# Patient Record
Sex: Female | Born: 1955 | Race: Black or African American | Hispanic: No | Marital: Single | Smoking: Former smoker
Health system: Southern US, Community
[De-identification: ages and names within clinical notes are randomized; demographics above are authoritative.]

## PROBLEM LIST (undated history)

## (undated) DIAGNOSIS — E119 Type 2 diabetes mellitus without complications: Secondary | ICD-10-CM

## (undated) DIAGNOSIS — I10 Essential (primary) hypertension: Secondary | ICD-10-CM

## (undated) HISTORY — PX: ABDOMINAL HYSTERECTOMY: SHX81

---

## 1998-06-17 DIAGNOSIS — E8941 Symptomatic postprocedural ovarian failure: Secondary | ICD-10-CM

## 2007-12-24 ENCOUNTER — Emergency Department (HOSPITAL_COMMUNITY): Admission: EM | Admit: 2007-12-24 | Discharge: 2007-12-24 | Payer: Self-pay | Admitting: Emergency Medicine

## 2009-08-14 IMAGING — CR DG CHEST 2V
2 series · 2 of 2 positions shown · non-contrast
Comparison: None

CLINICAL DATA: Pain in the left breast area.

CHEST - 2 VIEW

[w chest pa]
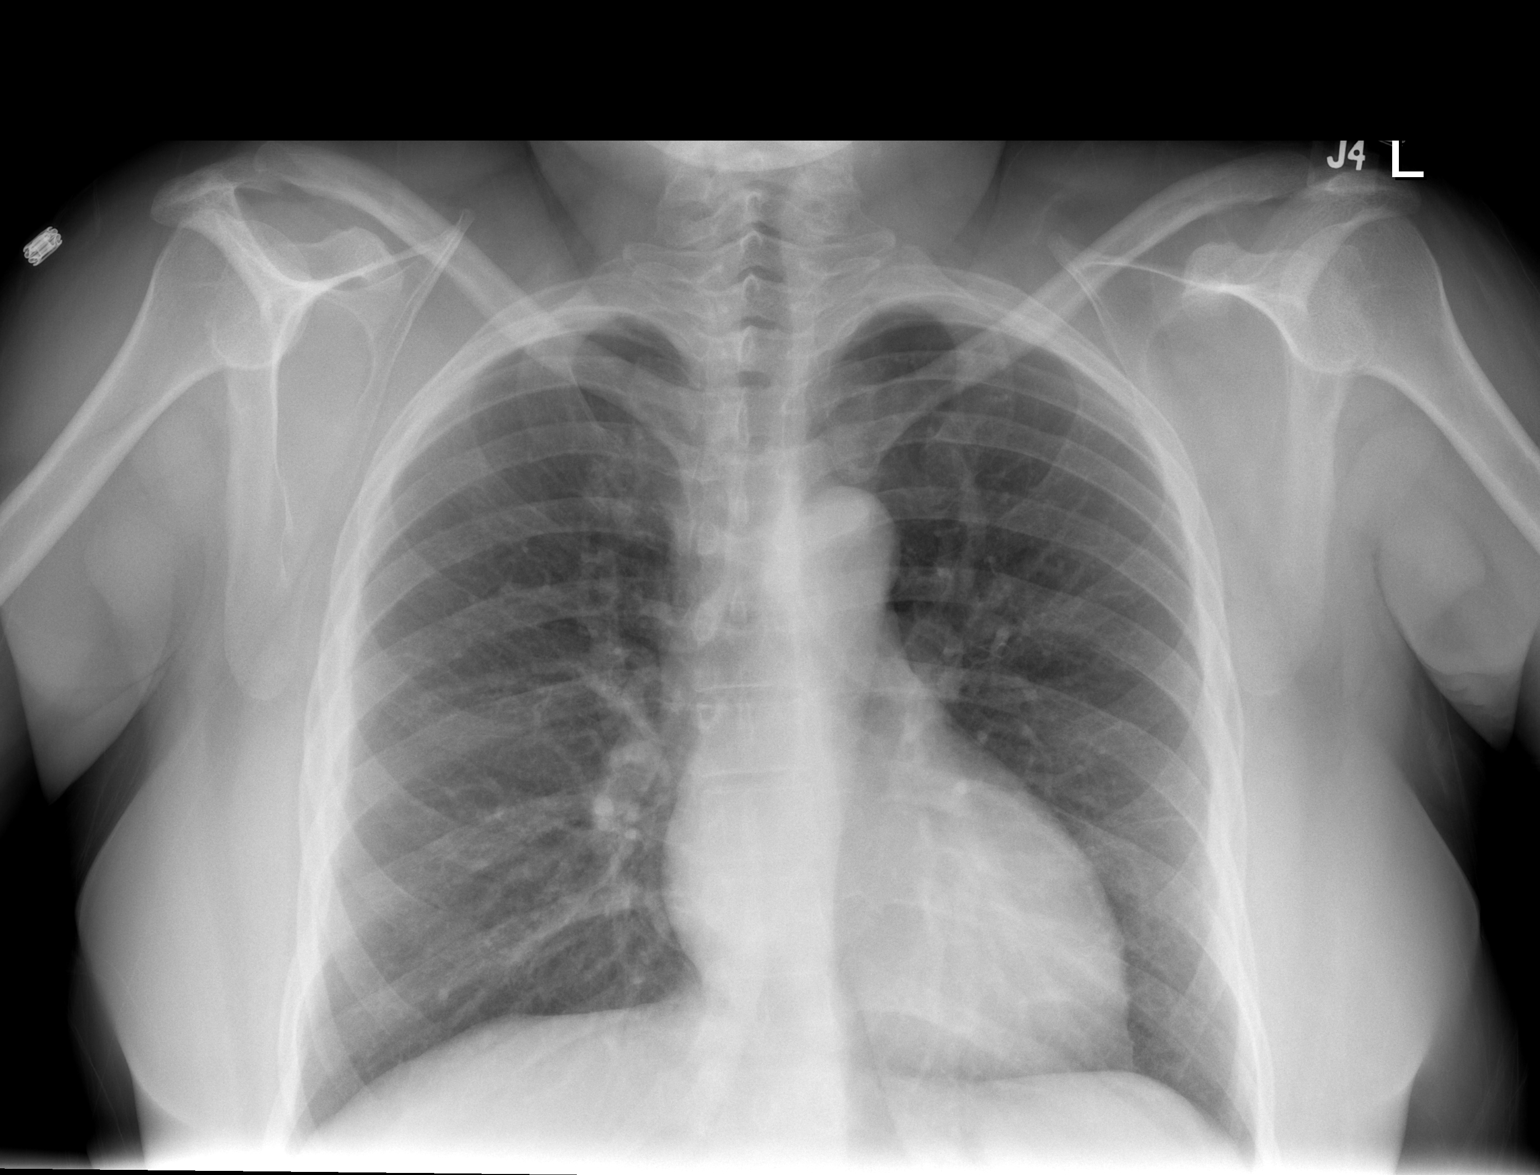

[w chest lat]
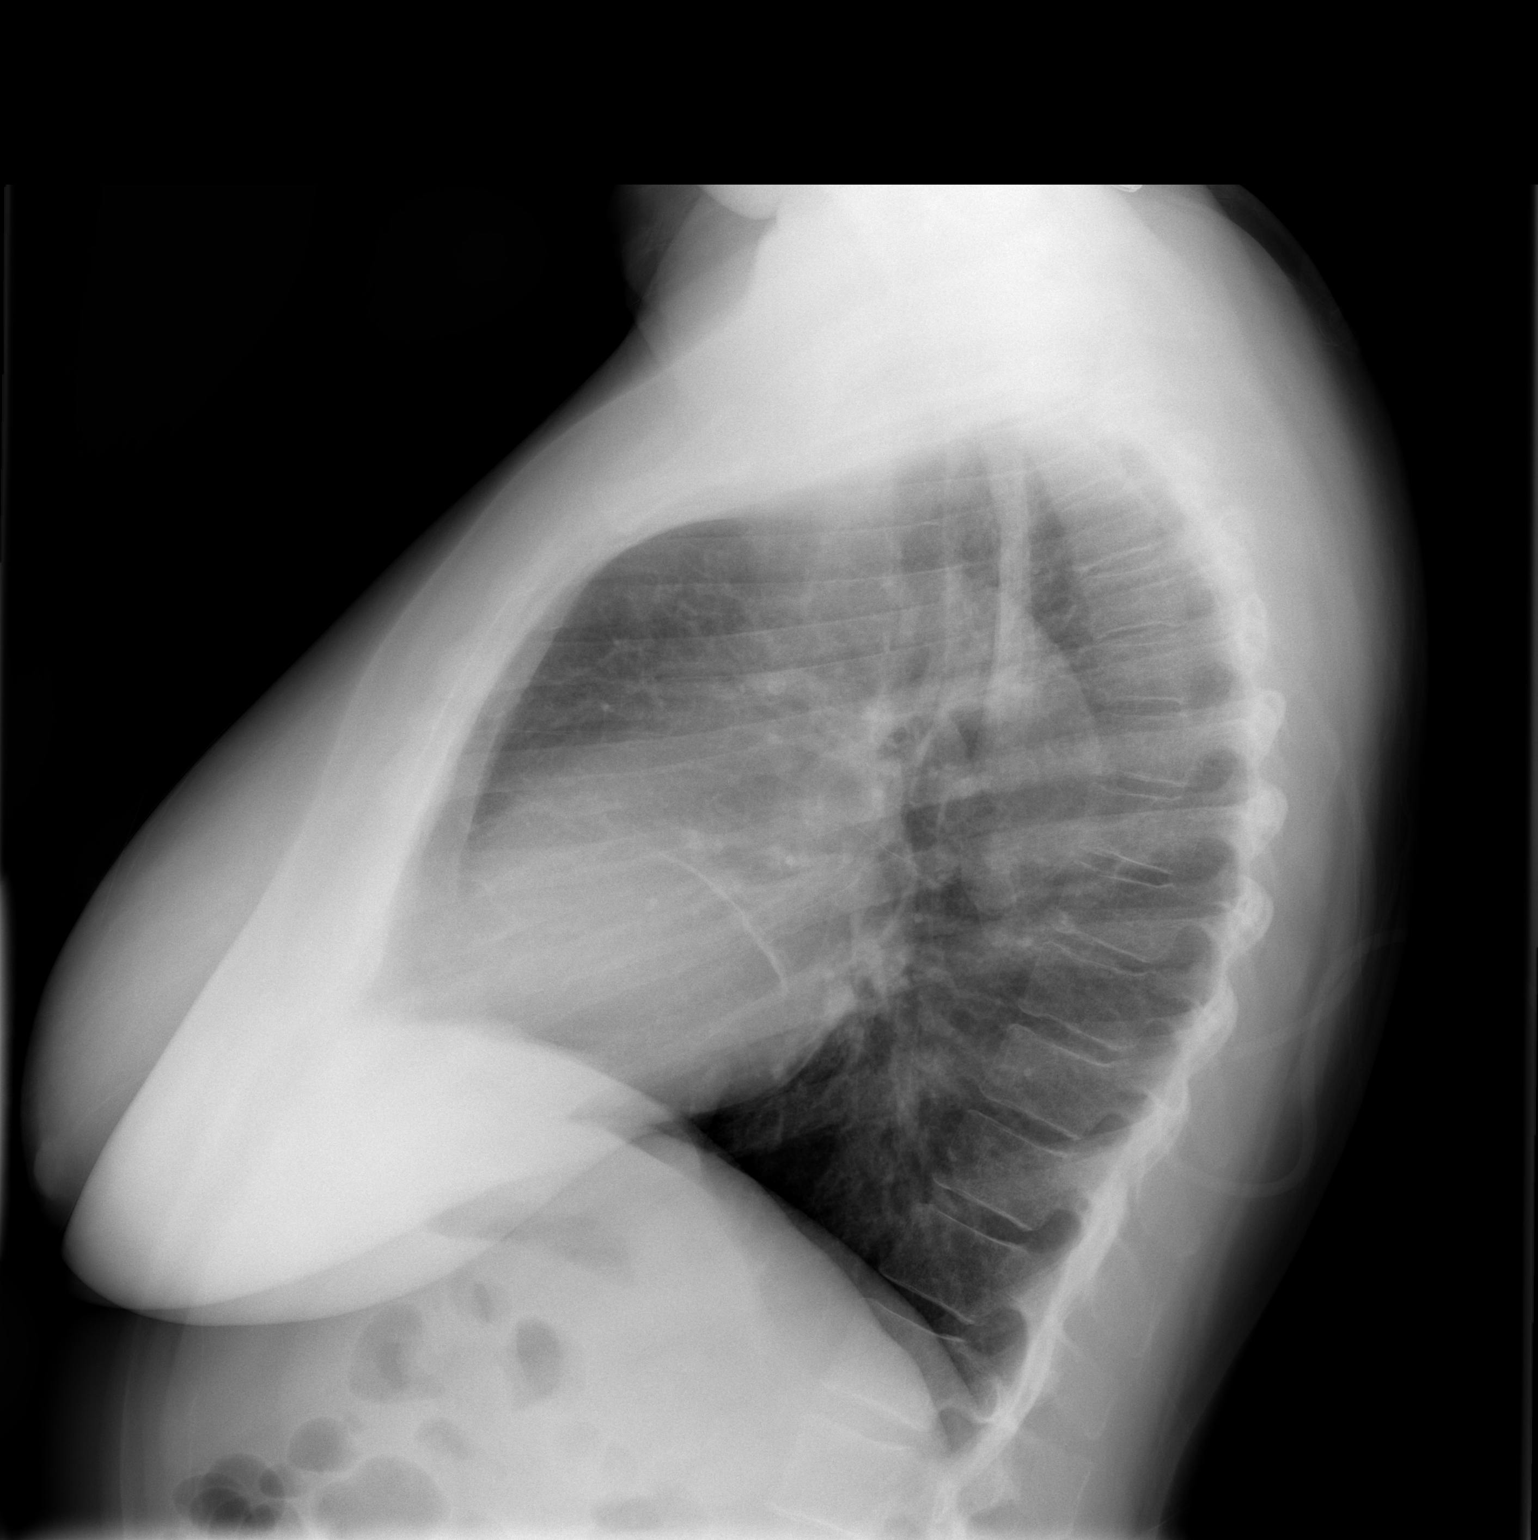

[2 of 2 positions shown; findings below may reference images not displayed]

FINDINGS: The cardiac silhouette is normal size and shape.  Lungs
are free of infiltrates.  No pleural effusions are identified.  A
linear area of subsegmental atelectasis or fibrosis is seen in the
right middle lobe.  Bones appear normal for age.
IMPRESSION: A linear area of subsegmental atelectasis or fibrosis is seen in
the right middle lobe.  Normal otherwise.

## 2009-10-16 ENCOUNTER — Inpatient Hospital Stay (HOSPITAL_COMMUNITY): Admission: EM | Admit: 2009-10-16 | Discharge: 2009-10-17 | Payer: Self-pay | Admitting: Emergency Medicine

## 2009-10-16 LAB — CONVERTED CEMR LAB
BUN: 8 mg/dL
CO2: 9 meq/L
Calcium: 8.1 mg/dL
Creatinine, Ser: 0.45 mg/dL
Glucose, Bld: 248 mg/dL
Platelets: 192 10*3/uL
RDW: 12.8 %
Sodium: 136 meq/L
WBC: 5.1 10*3/uL

## 2009-10-17 LAB — CONVERTED CEMR LAB
Cholesterol: 211 mg/dL
HDL: 40 mg/dL
LDL Cholesterol: 110 mg/dL
Triglycerides: 306 mg/dL
VLDL: 61 mg/dL

## 2009-11-02 ENCOUNTER — Ambulatory Visit: Payer: Self-pay | Admitting: Nurse Practitioner

## 2009-11-02 DIAGNOSIS — E1165 Type 2 diabetes mellitus with hyperglycemia: Secondary | ICD-10-CM

## 2009-11-02 DIAGNOSIS — E785 Hyperlipidemia, unspecified: Secondary | ICD-10-CM

## 2009-11-02 DIAGNOSIS — I10 Essential (primary) hypertension: Secondary | ICD-10-CM

## 2009-11-02 LAB — CONVERTED CEMR LAB
Bilirubin Urine: NEGATIVE
Blood in Urine, dipstick: NEGATIVE
Glucose, Urine, Semiquant: >=1000
Microalb, Ur: 0.5 mg/dL (ref 0.00–1.89)
Protein, U semiquant: NEGATIVE
Specific Gravity, Urine: 1.01
Urobilinogen, UA: NEGATIVE

## 2009-11-30 ENCOUNTER — Ambulatory Visit: Payer: Self-pay | Admitting: Nurse Practitioner

## 2009-11-30 LAB — CONVERTED CEMR LAB
Blood Glucose, Fingerstick: 157
Cholesterol, target level: 200 mg/dL
LDL Goal: 100 mg/dL

## 2010-03-05 ENCOUNTER — Other Ambulatory Visit: Admission: RE | Admit: 2010-03-05 | Discharge: 2010-03-05 | Payer: Self-pay | Admitting: Internal Medicine

## 2010-03-05 ENCOUNTER — Ambulatory Visit: Payer: Self-pay | Admitting: Nurse Practitioner

## 2010-03-05 LAB — CONVERTED CEMR LAB
Bilirubin Urine: NEGATIVE
Blood Glucose, Fingerstick: 95
Glucose, Urine, Semiquant: NEGATIVE
Hgb A1c MFr Bld: 8 %
KOH Prep: NEGATIVE
Ketones, urine, test strip: NEGATIVE
Nitrite: NEGATIVE
OCCULT 1: NEGATIVE
Protein, U semiquant: 30
Rapid HIV Screen: NEGATIVE
Specific Gravity, Urine: 1.015
Urobilinogen, UA: 1
pH: 6

## 2010-03-06 ENCOUNTER — Encounter (INDEPENDENT_AMBULATORY_CARE_PROVIDER_SITE_OTHER): Payer: Self-pay | Admitting: *Deleted

## 2010-03-06 ENCOUNTER — Encounter (INDEPENDENT_AMBULATORY_CARE_PROVIDER_SITE_OTHER): Payer: Self-pay | Admitting: Nurse Practitioner

## 2010-03-06 LAB — CONVERTED CEMR LAB
AST: 33 units/L (ref 0–37)
Albumin: 4.2 g/dL (ref 3.5–5.2)
Alkaline Phosphatase: 56 units/L (ref 39–117)
Basophils Relative: 0 % (ref 0–1)
Calcium: 9.2 mg/dL (ref 8.4–10.5)
Chlamydia, DNA Probe: NEGATIVE
Chloride: 101 meq/L (ref 96–112)
Eosinophils Absolute: 0 10*3/uL (ref 0.0–0.7)
Lymphs Abs: 2.2 10*3/uL (ref 0.7–4.0)
MCHC: 31.4 g/dL (ref 30.0–36.0)
MCV: 80.6 fL (ref 78.0–100.0)
Neutro Abs: 1.5 10*3/uL — ABNORMAL LOW (ref 1.7–7.7)
Neutrophils Relative %: 37 % — ABNORMAL LOW (ref 43–77)
Platelets: 296 10*3/uL (ref 150–400)
Potassium: 4.5 meq/L (ref 3.5–5.3)
RBC: 3.91 M/uL (ref 3.87–5.11)
Sodium: 138 meq/L (ref 135–145)
Total Protein: 7.5 g/dL (ref 6.0–8.3)
WBC: 4.2 10*3/uL (ref 4.0–10.5)

## 2010-03-16 ENCOUNTER — Encounter (INDEPENDENT_AMBULATORY_CARE_PROVIDER_SITE_OTHER): Payer: Self-pay | Admitting: Nurse Practitioner

## 2010-03-16 ENCOUNTER — Telehealth (INDEPENDENT_AMBULATORY_CARE_PROVIDER_SITE_OTHER): Payer: Self-pay | Admitting: Nurse Practitioner

## 2010-03-16 ENCOUNTER — Ambulatory Visit (HOSPITAL_COMMUNITY): Admission: RE | Admit: 2010-03-16 | Discharge: 2010-03-16 | Payer: Self-pay | Admitting: Internal Medicine

## 2010-03-20 ENCOUNTER — Encounter (INDEPENDENT_AMBULATORY_CARE_PROVIDER_SITE_OTHER): Payer: Self-pay | Admitting: Nurse Practitioner

## 2010-03-23 ENCOUNTER — Ambulatory Visit: Payer: Self-pay | Admitting: Nurse Practitioner

## 2010-03-23 LAB — CONVERTED CEMR LAB
Blood Glucose, Fingerstick: 116
HDL: 44 mg/dL (ref >39)
LDL Cholesterol: 134 mg/dL — ABNORMAL HIGH (ref 0–99)
Total CHOL/HDL Ratio: 4.5
Triglycerides: 98 mg/dL (ref <150)
VLDL: 20 mg/dL (ref 0–40)

## 2010-03-26 ENCOUNTER — Encounter (INDEPENDENT_AMBULATORY_CARE_PROVIDER_SITE_OTHER): Payer: Self-pay | Admitting: Nurse Practitioner

## 2010-07-17 NOTE — Letter (Signed)
Summary: *HSN Results Follow up  [facility]  [address] [place], [place] [address]   Phone: [phone]  Fax: [phone]      03/20/2010   Michelle Melendez [address] [place], [place]  [address]   Dear  Ms. Michelle Melendez,                            ____S.Drinkard,FNP   ____D. Gore,FNP       ____B. McPherson,MD   ____V. Rankins,MD    ____E. Mulberry,MD    __X__N. Daphine Deutscher, FNP  ____D. Reche Dixon, MD    ____K. Philipp Deputy, MD    ____Other     This letter is to inform you that your recent test(s):  Stool Cards    ___X____ is within acceptable limits  _______ requires a medication change  _______ requires a follow-up lab visit  _______ requires a follow-up visit with your provider   Comments: Stool cards x 3 are normal.  Thank you for returning them.       _________________________________________________________ If you have any questions, please contact our office [phone].                    Sincerely,    Lehman Prom FNP [facility]

## 2010-07-17 NOTE — Letter (Signed)
Summary: *HSN Results Follow up  [facility]  [address] [place], [place] [address]   Phone: [phone]  Fax: [phone]      03/06/2010   MYRELLA FAHS [address] [place], [place]  [address]   Dear  Ms. Taesha Vidales,                            ____S.Drinkard,FNP   ____D. Gore,FNP       ____B. McPherson,MD   ____V. Rankins,MD    ____E. Mulberry,MD    ____N. Daphine Deutscher, FNP  ____D. Reche Dixon, MD    ____K. Philipp Deputy, MD    ____Other     This letter is to inform you that your recent test(s):  _______Pap Smear    _______Lab Test     _______X-ray    _______ is within acceptable limits  ___X____ requires a medication change  _______ requires a follow-up lab visit  _______ requires a follow-up visit with your provider   Comments:  We have tried to reach you at [phone].  Please contact the office at your earliest convenience       _________________________________________________________ If you have any questions, please contact our office                     Sincerely,  Levon Hedger [facility]

## 2010-07-17 NOTE — Letter (Signed)
Summary: Handout Printed  Printed Handout:  - Diet - Iron Rich 

## 2010-07-17 NOTE — Miscellaneous (Signed)
Summary: Bone density results  Clinical Lists Changes  Observations: Added new observation of DEXANXTDUE: 03/2012 (03/26/2010 19:08) Added new observation of BONE DENSITY:  Lumbar Spine:  T Score > -1.0 Spine.  Hip Total: T Score > -1.0 Hip.   (03/16/2010 19:09)      Bone Density  Procedure date:  03/16/2010  Findings:       Lumbar Spine:  T Score > -1.0 Spine.  Hip Total: T Score > -1.0 Hip.    Comments:       Assessment:  Normal.    Procedures Next Due Date:    Bone Density: 03/2012   Bone Density  Procedure date:  03/16/2010  Findings:       Lumbar Spine:  T Score > -1.0 Spine.  Hip Total: T Score > -1.0 Hip.    Comments:       Assessment:  Normal.    Procedures Next Due Date:    Bone Density: 03/2012

## 2010-07-17 NOTE — Assessment & Plan Note (Signed)
Summary: Diabetes   Vital Signs:  Patient profile:   55 year old female Weight:      151 pounds Temp:     97.9 degrees F Pulse rate:   98 / minute Pulse rhythm:   regular Resp:     18 per minute BP sitting:   132 / 76  (left arm) Cuff size:   regular  Vitals Entered By: Vesta Mixer CMA (November 30, 2009 2:08 PM) CC: dm f/u, Lipid Management, Hypertension Management Is Patient Diabetic? Yes Pain Assessment Patient in pain? no      CBG Result 157  Does patient need assistance? Ambulation Normal   CC:  dm f/u, Lipid Management, and Hypertension Management.  History of Present Illness:  Pt into the office for f/u Seen initially on last month to establish care for diabetes  Diabetes -  no history of pneumovax - will give today pt is checking her blood sugar at least every other day before breakfast  no medications with pt today - advised her to bring all medications during visit  Diabetes Management History:      The patient is a 55 years old female who comes in for evaluation of Type 2 Diabetes Mellitus.  She has not been enrolled in the "Diabetic Education Program".  No sensory loss is reported.  Self foot exams are not being performed.  She is checking home blood sugars.  She says that she is not exercising regularly.        Hypoglycemic symptoms are not occurring.  No hyperglycemic symptoms are reported.  Other comments include: Pt has only been taking lantus 25 units instead of the 30 units as ordered.        Symptoms which suggest diabetic complications include vision problems.  The following changes have been made to her treatment plan since last visit: insulin dosing.  Treatment plan changes were initiated by MD and initiated by D.E.P..    Hypertension History:      She denies headache, chest pain, and palpitations.  She notes no problems with any antihypertensive medication side effects.  Pt is taking medication as ordered.        Positive major cardiovascular risk  factors include diabetes, hyperlipidemia, and hypertension.  Negative major cardiovascular risk factors include female age less than 85 years old and non-tobacco-user status.        Further assessment for target organ damage reveals no history of ASHD, stroke/TIA, or peripheral vascular disease.    Lipid Management History:      Positive NCEP/ATP III risk factors include diabetes and hypertension.  Negative NCEP/ATP III risk factors include female age less than 48 years old, no history of early menopause without estrogen hormone replacement, non-tobacco-user status, no ASHD (atherosclerotic heart disease), no prior stroke/TIA, no peripheral vascular disease, and no history of aortic aneurysm.        The patient states that she does not know about the "Therapeutic Lifestyle Change" diet.  The patient does not know about adjunctive measures for cholesterol lowering.  Comments include: pt is not fasting today.       Habits & Providers  Alcohol-Tobacco-Diet     Alcohol drinks/day: 0     Tobacco Status: never  Exercise-Depression-Behavior     Does Patient Exercise: no     Exercise Counseling: to improve exercise regimen     Type of exercise: walking     Exercise (avg: min/session): 30 min.     Times/week: 3  Drug Use: never  Comments: Pt still has not improved her exercise regimen  Allergies (verified): No Known Drug Allergies  Social History: Does Patient Exercise:  no  Review of Systems General:  Denies fever. ENT:  Denies nasal congestion. CV:  Denies chest pain or discomfort. Resp:  Denies cough. GI:  Denies abdominal pain, nausea, and vomiting. Allergy:  Denies sneezing.  Physical Exam  General:  alert.   Head:  normocephalic.   Eyes:  glasses Lungs:  normal breath sounds.   Heart:  normal rate and regular rhythm.   Msk:  bilateral bunions Neurologic:  alert & oriented X3.   Psych:  Oriented X3.    Diabetes Management Exam:       Nails:          Left foot:  thickened          Right foot: thickened   Impression & Recommendations:  Problem # 1:  DIABETES MELLITUS, UNCONTROLLED (ICD-250.02) advised pt to take lantus 30 units subcutaneously as ordered in addition to oral agents pt needs an eye exam - advised of free eye screening pneumovax given today Her updated medication list for this problem includes:    Benazepril Hcl 5 Mg Tabs (Benazepril hcl) ..... One tablet by mouth daily    Glyburide 2.5 Mg Tabs (Glyburide) ..... One tablet by mouth two times a day for blood sugar    Glucophage 1000 Mg Tabs (Metformin hcl) ..... One tablet by mouth two times a day    Lantus 100 Unit/ml Soln (Insulin glargine) .Marland KitchenMarland KitchenMarland KitchenMarland Kitchen 30 units subcutaneously nightly    Aspir-low 81 Mg Tbec (Aspirin) ..... One tablet by mouth daily for circulation  Problem # 2:  HYPERLIPIDEMIA (ICD-272.4) pt is not fasting today so unable to check labs Her updated medication list for this problem includes:    Simvastatin 20 Mg Tabs (Simvastatin) ..... One tablet by mouth nightly for cholesterol  Problem # 3:  HYPERTENSION, BENIGN ESSENTIAL (ICD-401.1)  Her updated medication list for this problem includes:    Benazepril Hcl 5 Mg Tabs (Benazepril hcl) ..... One tablet by mouth daily  Complete Medication List: 1)  Relion Ultra Thin Lancets 30g Misc (Lancets) 2)  Relion Ultima Test Strp (Glucose blood) 3)  Relion Confirm Glucose Monitor W/device Kit (Blood glucose monitoring suppl) 4)  Simvastatin 20 Mg Tabs (Simvastatin) .... One tablet by mouth nightly for cholesterol 5)  Benazepril Hcl 5 Mg Tabs (Benazepril hcl) .... One tablet by mouth daily 6)  Glyburide 2.5 Mg Tabs (Glyburide) .... One tablet by mouth two times a day for blood sugar 7)  Glucophage 1000 Mg Tabs (Metformin hcl) .... One tablet by mouth two times a day 8)  Lantus 100 Unit/ml Soln (Insulin glargine) .... 30 units subcutaneously nightly 9)  Aspir-low 81 Mg Tbec (Aspirin) .... One tablet by mouth daily for  circulation  Other Orders: Capillary Blood Glucose/CBG ([address]) Pneumococcal Vaccine (60454) Admin 1st Vaccine ([address]) Admin 1st Vaccine [facility]) [phone])  Diabetes Management Assessment/Plan:      The following lipid goals have been established for the patient: Total cholesterol goal of 200; LDL cholesterol goal of 100; HDL cholesterol goal of 40; Triglyceride goal of 150.  Her blood pressure goal is < 130/80.    Hypertension Assessment/Plan:      The patient's hypertensive risk group is category C: Target organ damage and/or diabetes.  Her calculated 10 year risk of coronary heart disease is 17 %.  Today's blood pressure is 132/76.  Her blood pressure  goal is < 130/80.  Lipid Assessment/Plan:      Based on NCEP/ATP III, the patient's risk factor category is "history of diabetes".  The patient's lipid goals are as follows: Total cholesterol goal is 200; LDL cholesterol goal is 100; HDL cholesterol goal is 40; Triglyceride goal is 150.    Patient Instructions: 1)  Follow up in 2 months for a complete physical exam 2)  Come fasting for this appointment - no food after midnight before this visit 3)  you will need tdap, lipids, PAP, Mammogram 4)  Free vision screening - December 06, 2009 5)  9AM to 3pm 6)  [place] DSS 7)  [address]  Prevention & Chronic Care Immunizations   Influenza vaccine: Not documented    Tetanus booster: Not documented    Pneumococcal vaccine: Pneumovax  (11/30/2009)  Colorectal Screening   Hemoccult: Not documented    Colonoscopy: Not documented  Other Screening   Pap smear: Not documented    Mammogram: Not documented   Smoking status: never  (11/30/2009)  Diabetes Mellitus   HgbA1C: >14.0  (11/02/2009)    Eye exam: Not documented    Foot exam: Not documented   High risk foot: Not documented   Foot care education: Not documented    Urine microalbumin/creatinine ratio: Not documented  Lipids   Total Cholesterol: 211   (10/17/2009)   LDL: 110  (10/17/2009)   LDL Direct: Not documented   HDL: 40  (10/17/2009)   Triglycerides: 306  (10/17/2009)    SGOT (AST): Not documented   SGPT (ALT): Not documented   Alkaline phosphatase: Not documented   Total bilirubin: Not documented  Hypertension   Last Blood Pressure: 132 / 76  (11/30/2009)   Serum creatinine: 0.45  (10/16/2009)   Serum potassium 3.4  (10/16/2009)  Self-Management Support :    Diabetes self-management support: Not documented    Hypertension self-management support: Not documented    Lipid self-management support: Not documented    Nursing Instructions: Give Pneumovax today    Pneumovax Vaccine    Vaccine Type: Pneumovax    Site: right deltoid    Mfr: Merck    Dose: 0.5 ml    Route: IM    Given by: Vesta Mixer CMA    Exp. Date: 06/16/2011    Lot #: [id number]    VIS given: 01/13/96 version given November 30, 2009.

## 2010-07-17 NOTE — Assessment & Plan Note (Signed)
Summary: New - Hospital F/U   Vital Signs:  Patient profile:   54 year old female Height:      60.25 inches Weight:      147.8 pounds BMI:     28.73 BSA:     1.65 Temp:     98.2 degrees F oral Pulse rate:   90 / minute Pulse rhythm:   regular Resp:     20 per minute BP sitting:   135 / 79  (left arm) Cuff size:   regular  Vitals Entered ByLevon Hedger (Nov 02, 2009 3:03 PM) CC: follow-up visit [facility] new diabetic Is Patient Diabetic? Yes Pain Assessment Patient in pain? no      CBG Result 264 CBG Device ID A  Does patient need assistance? Functional Status Self care Ambulation Normal   CC:  follow-up visit [facility] new diabetic.  History of Present Illness:  Pt into the office to establish care. No previous PCP  Recent hospitalization on 10/16/2009 at which time she was dx with diabetes  PMH - none prior to recent hospitalizations PSH - Cesarean in one of four deliveries  Prior to presentation to the ER pt was feeling tired and still going to work.   She eventually went to the ER and blood sugar was found to be over 600. Pt was started on lantus 20 units, metformin 750mg  by mouth two times a day, and glyburide 2.5mg  two times a day. Diabetic education done in the hospital. She also received a blood sugar machine and she has been checking blood sugar at least once daily.  Prolonged QT on the last EKG - Cardiac enzymes negative and this was felt seconary to hyperkalemia  Pt has purchased her medications from  [facility] as she has insurance but has lost her job so insurance will stop at the end of this month.    Diabetes Management History:      The patient is a 56 years old female who comes in for evaluation of DM Type 2.  She states lack of understanding of dietary principles and is not following her diet appropriately.  No sensory loss is reported.  Self foot exams are not being performed.  She is checking home blood sugars.        Hypoglycemic symptoms are not occurring.         The following changes have been made to her treatment plan since last visit: insulin dosing and medication changes.  Treatment plan changes were initiated by MD.     Habits & Providers  Alcohol-Tobacco-Diet     Alcohol drinks/day: 0     Tobacco Status: never  Exercise-Depression-Behavior     Exercise Counseling: to improve exercise regimen     Type of exercise: walking     Exercise (avg: min/session): 30 min.     Times/week: 3     Have you felt down or hopeless? no     Have you felt little pleasure in things? no     Drug Use: never  Allergies (verified): No Known Drug Allergies  Past History:  Past Surgical History: c-section x 1  Family History: younger sister - diabetes mother - htn (deceased at age 10) father - leg amputation in a farming accident, deceased from sepsis at age 21  Social History: 4 children tobacco  - none ETOH - none Drug - noneSmoking Status:  never Drug Use:  never  Review of Systems General:  Complains of weight loss; 15 pounds prior to dx.  Eyes:  Denies blurring. CV:  Denies chest pain or discomfort. Resp:  Denies cough. GI:  Denies abdominal pain, nausea, and vomiting. Endo:  Complains of excessive thirst and excessive urination; symptoms have improved since hospitalization.  Physical Exam  General:  alert.   Head:  normocephalic.   Mouth:  poor dentition.   Lungs:  normal breath sounds.   Heart:  normal rate and regular rhythm.   Msk:  up to the exam table Neurologic:  alert & oriented X3.     Impression & Recommendations:  Problem # 1:  DIABETES MELLITUS, UNCONTROLLED (ICD-250.02) new onset pt has a glucometer and has started to check her sugar advised her to do so at least twice daily for now will increase lantus to 30 units Her updated medication list for this problem includes:    Benazepril Hcl 5 Mg Tabs (Benazepril hcl) ..... One tablet by mouth daily    Glyburide 2.5 Mg Tabs (Glyburide) ..... One tablet by mouth  two times a day for blood sugar    Glucophage 1000 Mg Tabs (Metformin hcl) ..... One tablet by mouth two times a day    Lantus 100 Unit/ml Soln (Insulin glargine) .Marland KitchenMarland KitchenMarland KitchenMarland Kitchen 30 units subcutaneously nightly    Aspir-low 81 Mg Tbec (Aspirin)  Orders: UA Dipstick w/o Micro (manual) (04540) Hemoglobin A1C (83036) T-Urine Microalbumin w/creat. ratio [phone])  Problem # 2:  HYPERTENSION, BENIGN ESSENTIAL (ICD-401.1) will monitor advised pt to decrease sodium in her diet Her updated medication list for this problem includes:    Benazepril Hcl 5 Mg Tabs (Benazepril hcl) ..... One tablet by mouth daily  Problem # 3:  HYPERLIPIDEMIA (ICD-272.4) started on statin in hospital will check lipids on next visit Her updated medication list for this problem includes:    Simvastatin 20 Mg Tabs (Simvastatin) ..... One tablet by mouth nightly for cholesterol  Complete Medication List: 1)  Relion Ultra Thin Lancets 30g Misc (Lancets) 2)  Relion Ultima Test Strp (Glucose blood) 3)  Relion Confirm Glucose Monitor W/device Kit (Blood glucose monitoring suppl) 4)  Simvastatin 20 Mg Tabs (Simvastatin) .... One tablet by mouth nightly for cholesterol 5)  Benazepril Hcl 5 Mg Tabs (Benazepril hcl) .... One tablet by mouth daily 6)  Glyburide 2.5 Mg Tabs (Glyburide) .... One tablet by mouth two times a day for blood sugar 7)  Glucophage 1000 Mg Tabs (Metformin hcl) .... One tablet by mouth two times a day 8)  Lantus 100 Unit/ml Soln (Insulin glargine) .... 30 units subcutaneously nightly 9)  Aspir-low 81 Mg Tbec (Aspirin)  Other Orders: Capillary Blood Glucose/CBG (13086)  Patient Instructions: 1)  FRONT DESK STAFF - Explain eligibility process to pt 2)  Schedule an appointment 3)  Diabetes - increase insulin to lantus 30 units nightly 4)  Metformin - may continue 750mg  by mouth two times a day for now but will increase 1000mg  by mouth two times a day on refill. 5)  Follow up in 4 weeks with n.martin,  fnp for diabetes. 6)  Do not eat after midnight before this visit - you will need cholesterol repeated. Will need pneumovax and foot check. 7)  Bring blood sugar log with you to this visit. Prescriptions: LANTUS 100 UNIT/ML SOLN (INSULIN GLARGINE) 30 units subcutaneously nightly  #1 month qs x 1   Entered and Authorized by:   Lehman Prom FNP   Signed by:   Lehman Prom FNP on 11/02/2009   Method used:   Print then Give to Patient   RxID:  [id number] GLUCOPHAGE 1000 MG TABS (METFORMIN HCL) One tablet by mouth two times a day  #60 x 1   Entered and Authorized by:   Lehman Prom FNP   Signed by:   Lehman Prom FNP on 11/02/2009   Method used:   Print then Give to Patient   RxID:   [id number] GLYBURIDE 2.5 MG TABS (GLYBURIDE) One tablet by mouth two times a day for blood sugar  #60 x 1   Entered and Authorized by:   Lehman Prom FNP   Signed by:   Lehman Prom FNP on 11/02/2009   Method used:   Print then Give to Patient   RxID:   [id number] BENAZEPRIL HCL 5 MG TABS (BENAZEPRIL HCL) One tablet by mouth daily  #30 x 1   Entered and Authorized by:   Lehman Prom FNP   Signed by:   Lehman Prom FNP on 11/02/2009   Method used:   Print then Give to Patient   RxID:   [id number] SIMVASTATIN 20 MG TABS (SIMVASTATIN) One tablet by mouth nightly for cholesterol  #30 x 1   Entered and Authorized by:   Lehman Prom FNP   Signed by:   Lehman Prom FNP on 11/02/2009   Method used:   Print then Give to Patient   RxID:   [id number]   Laboratory Results   Urine Tests  Date/Time Received: Nov 02, 2009 4:04 PM   Routine Urinalysis   Color: lt. yellow Appearance: Clear Glucose: >=1000   (Normal Range: Negative) Bilirubin: negative   (Normal Range: Negative) Ketone: negative   (Normal Range: Negative) Spec. Gravity: 1.010   (Normal Range: 1.003-1.035) Blood: negative   (Normal Range: Negative) pH: 6.5   (Normal Range:  5.0-8.0) Protein: negative   (Normal Range: Negative) Urobilinogen: negative   (Normal Range: 0-1) Nitrite: negative   (Normal Range: Negative) Leukocyte Esterace: negative   (Normal Range: Negative)     Blood Tests   Date/Time Received: Nov 02, 2009 3:34 PM   HGBA1C: >14.0%   (Normal Range: Non-Diabetic - 3-6%   Control Diabetic - 6-8%) CBG Random:: 264     Laboratory Results   Urine Tests    Routine Urinalysis   Color: lt. yellow Appearance: Clear Glucose: >=1000   (Normal Range: Negative) Bilirubin: negative   (Normal Range: Negative) Ketone: negative   (Normal Range: Negative) Spec. Gravity: 1.010   (Normal Range: 1.003-1.035) Blood: negative   (Normal Range: Negative) pH: 6.5   (Normal Range: 5.0-8.0) Protein: negative   (Normal Range: Negative) Urobilinogen: negative   (Normal Range: 0-1) Nitrite: negative   (Normal Range: Negative) Leukocyte Esterace: negative   (Normal Range: Negative)     Blood Tests     HGBA1C: >14.0%   (Normal Range: Non-Diabetic - 3-6%   Control Diabetic - 6-8%) CBG Random:: 264mg /dL

## 2010-07-17 NOTE — Letter (Signed)
Summary: PT INFORMATION SHEET  PT INFORMATION SHEET   Imported By: Arta Bruce 12/25/2009 09:35:52  _____________________________________________________________________  External Attachment:    Type:   Image     Comment:   External Document

## 2010-07-17 NOTE — Letter (Signed)
Summary: Handout Printed  Printed Handout:  - Diet - Low-Fat, Low-Saturated-Fat, Low-Cholesterol Diets 

## 2010-07-17 NOTE — Progress Notes (Signed)
Summary: Office Visit//SDEPRESSION SCREENING  Office Visit//SDEPRESSION SCREENING   Imported By: Arta Bruce 03/09/2010 15:03:19  _____________________________________________________________________  External Attachment:    Type:   Image     Comment:   External Document

## 2010-07-17 NOTE — Letter (Signed)
Summary: Lipid Letter  [facility]  [address] [place], [place] [address]   Phone: [phone]  Fax: [phone]    03/06/2010  Shaleka Brines [address] [place], [place]  [address]  Dear Steward Drone:  We have carefully reviewed your last lipid profile from 10/17/2009 and the results are noted below with a summary of recommendations for lipid management.    Cholesterol:      211    Goal: less than 200   HDL "good" Cholesterol:   40     Goal: greater than 40   LDL "bad" Cholesterol:  110    Goal: less than 70   Triglycerides:      306    Goal: less than 150  Labs done during recent office visit show that you are anemic.  You should have been notified by this office about the need to start an iron supplement.  See attached handout for iron rich foods. Pap Smear results ______________________. Your cholesterol is still elevated, especially triglycerides.  You should avoid fried fatty foods.  Continue to take simvastatin as ordered.    Current Medications: 1)    Relion Ultra Thin Lancets 30g  Misc (Lancets) 2)    Relion Ultima Test  Strp (Glucose blood) 3)    Relion Confirm Glucose Monitor W/device Kit (Blood glucose monitoring suppl) 4)    Simvastatin 20 Mg Tabs (Simvastatin) .... One tablet by mouth nightly for cholesterol 5)    Benazepril Hcl 5 Mg Tabs (Benazepril hcl) .... One tablet by mouth daily 6)    Glyburide 5 Mg Tabs (Glyburide) .... One tablet by mouth two times a day for diabetes **note change in dose** 7)    Glucophage 1000 Mg Tabs (Metformin hcl) .... One tablet by mouth two times a day 8)    Lantus 100 Unit/ml Soln (Insulin glargine) .... 30 units subcutaneously nightly 9)    Aspir-low 81 Mg Tbec (Aspirin) .... One tablet by mouth daily for circulation 10)    Ferrous Sulfate 325 (65 Fe) Mg Tabs (Ferrous sulfate) .... One tablet by mouth two times a day  If you have any questions, please call. We appreciate being able to work with  you.   Sincerely,    [facility] NyKedtra Martin,FNP

## 2010-07-17 NOTE — Letter (Signed)
Summary: TEST ORDER MAMMOGRAM//APPT DATE & TIME  TEST ORDER MAMMOGRAM//APPT DATE & TIME   Imported By: Arta Bruce 03/09/2010 15:07:58  _____________________________________________________________________  External Attachment:    Type:   Image     Comment:   External Document

## 2010-07-17 NOTE — Assessment & Plan Note (Signed)
Summary: Complete Physical Exam   Vital Signs:  Patient profile:   55 year old female Menstrual status:  hysterectomy Weight:      148.9 pounds BMI:     28.94 Temp:     98.4 degrees F oral Pulse rate:   88 / minute Pulse rhythm:   regular Resp:     20 per minute BP sitting:   110 / 72  (left arm) Cuff size:   regular  Vitals Entered By: Levon Hedger (March 05, 2010 2:15 PM)  Nutrition Counseling: Patient's BMI is greater than 25 and therefore counseled on weight management options. CC: CPP...feeling very fatigued and laying around alot, Hypertension Management, Lipid Management Pain Assessment Patient in pain? no      CBG Result 95 CBG Device ID A  Does patient need assistance? Functional Status Self care Ambulation Normal     Menstrual Status hysterectomy   CC:  CPP...feeling very fatigued and laying around alot, Hypertension Management, and Lipid Management.  History of Present Illness:  Pt into the office for a complete physical exam  PAP - Last PAP done over 3 years.  Pt had total hysterectomy.  Mammogram - last done about 3 years ago no family history of breast cancer  Optho - last done in June.  She was sponsored by the [employer].  She is in the process of getting referred to get new glasses  Dental - no recent dental exams and no current problems with teeth  Tdap - known that it was within the past 10 years but pt is unsure of the date   Diabetes Management History:      The patient is a 55 years old female who comes in for evaluation of Type 2 Diabetes Mellitus.  She has not been enrolled in the "Diabetic Education Program".  She states understanding of dietary principles and is following her diet appropriately.  No sensory loss is reported.  Self foot exams are not being performed.  She is checking home blood sugars.  She says that she is not exercising regularly.        Hypoglycemic symptoms are not occurring.  No hyperglycemic symptoms are  reported.    Hypertension History:      She denies headache, chest pain, and palpitations.  She notes no problems with any antihypertensive medication side effects.        Positive major cardiovascular risk factors include diabetes, hyperlipidemia, and hypertension.  Negative major cardiovascular risk factors include female age less than 49 years old and non-tobacco-user status.        Further assessment for target organ damage reveals no history of ASHD, cardiac end-organ damage (CHF/LVH), stroke/TIA, peripheral vascular disease, renal insufficiency, or hypertensive retinopathy.    Lipid Management History:      Positive NCEP/ATP III risk factors include diabetes and hypertension.  Negative NCEP/ATP III risk factors include female age less than 84 years old, no history of early menopause without estrogen hormone replacement, non-tobacco-user status, no ASHD (atherosclerotic heart disease), no prior stroke/TIA, no peripheral vascular disease, and no history of aortic aneurysm.        The patient states that she does not know about the "Therapeutic Lifestyle Change" diet.  The patient does not know about adjunctive measures for cholesterol lowering.  She expresses no side effects from her lipid-lowering medication.  The patient denies any symptoms to suggest myopathy or liver disease.      Diabetic Foot Exam Foot Inspection Is there a history  of a foot ulcer?              No Is there a foot ulcer now?              No Can the patient see the bottom of their feet?          No Are the shoes appropriate in style and fit?          Yes Is there swelling or an abnormal foot shape?          No Are the toenails long?                No Are the toenails thick?                Yes Are the toenails ingrown?              No Is there heavy callous build-up?              No Is there a claw toe deformity?              No Is there elevated skin temperature?            No Is there limited ankle dorsiflexion?             No Is there foot or ankle muscle weakness?            No  Diabetic Foot Care Education Patient educated on appropriate care of diabetic feet.  Pulse Check          Right Foot          Left Foot Dorsalis Pedis:        normal            normal    10-g (5.07) Semmes-Weinstein Monofilament Test Performed by: Levon Hedger          Right Foot          Left Foot Visual Inspection                Habits & Providers  Alcohol-Tobacco-Diet     Alcohol drinks/day: 0     Tobacco Status: never  Exercise-Depression-Behavior     Does Patient Exercise: no     Exercise Counseling: to improve exercise regimen     Type of exercise: walking     Exercise (avg: min/session): 30 min.     Times/week: 3     Drug Use: never  Comments: PHQ- 9 score = 10  Allergies: No Known Drug Allergies  Review of Systems General:  Complains of fatigue and sleep disorder; Noticed for the past month she has been weak and is having problems sleeping. Eyes:  Complains of blurring. ENT:  Denies earache. CV:  Complains of fatigue. Resp:  Denies cough. GI:  Denies abdominal pain, nausea, and vomiting. GU:  Denies discharge. MS:  Denies joint pain. Derm:  Denies rash. Neuro:  Denies headaches. Psych:  Denies anxiety and depression.  Physical Exam  General:  alert.   Head:  normocephalic.   Eyes:  pupils equal, pupils round, and pupils reactive to light.   Ears:  bil TM with bony landmarks present Nose:  no nasal discharge.   Neck:  supple.   Chest Wall:  no mass.   Breasts:  skin/areolae normal, no masses, and no abnormal thickening.   Lungs:  normal breath sounds.   Heart:  normal rate and regular rhythm.   Abdomen:  normal bowel sounds.  Rectal:  external hemorrhoid(s).   Msk:  bunions bilaterally Pulses:  R radial normal and L radial normal.   Extremities:  no edema Neurologic:  alert & oriented X3, cranial nerves II-XII intact, and gait normal.   Skin:  color normal.   Psych:  Oriented  X3.    Pelvic Exam  Vulva:      normal appearance.   Urethra and Bladder:      Urethra--normal.   Vagina:      physiologic discharge.   Cervix:      absent Adnexa:      nontender bilaterally.   Rectum:      heme negative stool, + external hemorrhoids.    Diabetes Management Exam:    Foot Exam (with socks and/or shoes not present):       Sensory-Pinprick/Light touch:          Left medial foot (L-4): normal          Left dorsal foot (L-5): normal          Left lateral foot (S-1): normal          Right medial foot (L-4): normal          Right dorsal foot (L-5): normal          Right lateral foot (S-1): normal       Sensory-Monofilament:          Left foot: normal          Right foot: normal       Inspection:          Left foot: normal          Right foot: normal       Nails:          Left foot: thickened          Right foot: thickened    Eye Exam:       Eye Exam done elsewhere          Date: 11/15/2009          Results: normal          Done by: [employer]   Diabetic Foot Exam Foot Inspection Is there a history of a foot ulcer?              No Is there a foot ulcer now?              No Can the patient see the bottom of their feet?          No Are the shoes appropriate in style and fit?          Yes Is there swelling or an abnormal foot shape?          No Are the toenails long?                No Are the toenails thick?                Yes Are the toenails ingrown?              No Is there heavy callous build-up?              No Is there a claw toe deformity?              No Is there elevated skin temperature?            No Is there limited ankle dorsiflexion?  No Is there foot or ankle muscle weakness?            No  Diabetic Foot Care Education Patient educated on appropriate care of diabetic feet.  Pulse Check          Right Foot          Left Foot Dorsalis Pedis:        normal            normal    10-g (5.07) Semmes-Weinstein Monofilament  Test Performed by: Levon Hedger          Right Foot          Left Foot Visual Inspection                 Impression & Recommendations:  Problem # 1:  ROUTINE GYNECOLOGICAL EXAMINATION (ICD-V72.31) PAP done  PHQ- 9 score = 10 labs done exept cholesterol as pt is not fasting rec optho and dental exam guaiac negative  Orders: T- GC Chlamydia (04540) KOH/ WET Mount [phone]) Pap Smear, Thin Prep ( Collection of) (J4782) Hemoccult Guaiac-1 spec.(in office) (82270) T-Syphilis Test (RPR) [phone]) Rapid HIV  (78469) T-CBC w/Diff (62952-84132) T-Comprehensive Metabolic Panel (44010-27253) Hemoccult Cards (Take Home) (Hemoccult Cards)  Problem # 2:  UNSPECIFIED BREAST SCREENING (ICD-V76.10) self breast exam placcard mammogram scheduled Orders: Mammogram (Screening) (Mammo)  Problem # 3:  MENOPAUSE, SURGICAL (ICD-627.4) advise pt of the indication will order bone density Orders: Dexa scan (Dexa scan)  Problem # 4:  DIABETES MELLITUS, UNCONTROLLED (ICD-250.02)  Her updated medication list for this problem includes:    Benazepril Hcl 5 Mg Tabs (Benazepril hcl) ..... One tablet by mouth daily    Glyburide 5 Mg Tabs (Glyburide) ..... One tablet by mouth two times a day for diabetes **note change in dose**    Glucophage 1000 Mg Tabs (Metformin hcl) ..... One tablet by mouth two times a day    Lantus 100 Unit/ml Soln (Insulin glargine) .Marland KitchenMarland KitchenMarland KitchenMarland Kitchen 30 units subcutaneously nightly    Aspir-low 81 Mg Tbec (Aspirin) ..... One tablet by mouth daily for circulation  Orders: Hemoglobin A1C (83036) UA Dipstick w/o Micro (manual) (66440)  Problem # 5:  HYPERTENSION, BENIGN ESSENTIAL (ICD-401.1)  Her updated medication list for this problem includes:    Benazepril Hcl 5 Mg Tabs (Benazepril hcl) ..... One tablet by mouth daily  Orders: EKG w/ Interpretation (93000)  Problem # 6:  HYPERLIPIDEMIA (ICD-272.4)  Her updated medication list for this problem includes:    Simvastatin 20 Mg  Tabs (Simvastatin) ..... One tablet by mouth nightly for cholesterol  Complete Medication List: 1)  Relion Ultra Thin Lancets 30g Misc (Lancets) 2)  Relion Ultima Test Strp (Glucose blood) 3)  Relion Confirm Glucose Monitor W/device Kit (Blood glucose monitoring suppl) 4)  Simvastatin 20 Mg Tabs (Simvastatin) .... One tablet by mouth nightly for cholesterol 5)  Benazepril Hcl 5 Mg Tabs (Benazepril hcl) .... One tablet by mouth daily 6)  Glyburide 5 Mg Tabs (Glyburide) .... One tablet by mouth two times a day for diabetes **note change in dose** 7)  Glucophage 1000 Mg Tabs (Metformin hcl) .... One tablet by mouth two times a day 8)  Lantus 100 Unit/ml Soln (Insulin glargine) .... 30 units subcutaneously nightly 9)  Aspir-low 81 Mg Tbec (Aspirin) .... One tablet by mouth daily for circulation  Other Orders: Capillary Blood Glucose/CBG (34742)  Diabetes Management Assessment/Plan:      The following lipid goals have been established for the patient: Total  cholesterol goal of 200; LDL cholesterol goal of 100; HDL cholesterol goal of 40; Triglyceride goal of 150.  Her blood pressure goal is < 130/80.    Hypertension Assessment/Plan:      The patient's hypertensive risk group is category C: Target organ damage and/or diabetes.  Her calculated 10 year risk of coronary heart disease is 11 %.  Today's blood pressure is 110/72.  Her blood pressure goal is < 130/80.  Lipid Assessment/Plan:      Based on NCEP/ATP III, the patient's risk factor category is "history of diabetes".  The patient's lipid goals are as follows: Total cholesterol goal is 200; LDL cholesterol goal is 100; HDL cholesterol goal is 40; Triglyceride goal is 150.    Patient Instructions: 1)  Labs done today except cholesterol since you were not fasting.  2)  Diabetes - Your hbga1c was 14 and is now down to 8. Doing much better but the goal is less than 7. 3)  Will increase glyburide to 5mg  by mouth two times a day  4)  All other  medications will stay the same 5)  Weakness - will check labs for possible causes 6)  Be sure you are eating three meals per day with snacks in between as this is helpful for your blood sugar 7)  Schedule an appointment in  2 weeks with n.martin,fnp 8)  Will need lipids - no food after midnight before this visit 9)  Will also need flu vaccine if available Prescriptions: GLYBURIDE 5 MG TABS (GLYBURIDE) One tablet by mouth two times a day for diabetes **note change in dose**  #60 x 5   Entered and Authorized by:   Lehman Prom FNP   Signed by:   Lehman Prom FNP on 03/05/2010   Method used:   Print then Give to Patient   RxID:   [id number]      Last LDL:                                                 110 (10/17/2009 2:57:14 PM)        Diabetic Foot Exam Diabetic Foot Care Education :Patient educated on appropriate care of diabetic feet.  Pulse Check          Right Foot          Left Foot Dorsalis Pedis:        normal            normal    10-g (5.07) Semmes-Weinstein Monofilament Test Performed by: Levon Hedger          Right Foot          Left Foot Visual Inspection               Test Control      normal         normal Site 1         normal         normal Site 2         normal         normal Site 3         normal         normal Site 4         normal         normal Site 5  normal         normal Site 6         normal         normal Site 7         normal         normal Site 8         normal         normal Site 9         normal         normal Site 10         normal         normal  Impression      normal         normal   Laboratory Results   Urine Tests  Date/Time Received: March 05, 2010 2:37 PM   Routine Urinalysis   Color: brown Appearance: Hazy Glucose: negative   (Normal Range: Negative) Bilirubin: negative   (Normal Range: Negative) Ketone: negative   (Normal Range: Negative) Spec. Gravity: 1.015   (Normal Range:  1.003-1.035) Blood: small   (Normal Range: Negative) pH: 6.0   (Normal Range: 5.0-8.0) Protein: 30   (Normal Range: Negative) Urobilinogen: 1.0   (Normal Range: 0-1) Nitrite: negative   (Normal Range: Negative) Leukocyte Esterace: small   (Normal Range: Negative)     Blood Tests   Date/Time Received: March 05, 2010 2:40 PM   HGBA1C: 8.0%   (Normal Range: Non-Diabetic - 3-6%   Control Diabetic - 6-8%) CBG Random:: 95  Date/Time Received: March 05, 2010 5:39 PM   Wet Mount/KOH Source: vaginal WBC/hpf: 1-5 Bacteria/hpf: rare Clue cells/hpf: none Yeast/hpf: none Trichomonas/hpf: none  Other Tests  Rapid HIV: negative  Stool - Occult Blood Hemmoccult #1: negative Date: 03/05/2010    Osteoporosis  Patient complains of: kyphosis No back pain No prior fracture No height loss No  Patient reports: Personal history of fracture No Hx of Fx in a 1st degree relative No Caucasian/Asian Race No Advanced Age No Female Gender Yes Dementia No Poor health/fragility No Current cigarette smoker No Low body weight (<127 lbs) No Estrogen deficiency Yes Low calcium intake (lifelong) No Alcoholism No Inadequate physical activity No Poor eyesight/risk of falls No    EKG  Procedure date:  03/05/2010  Findings:      NSR   Prevention & Chronic Care Immunizations   Influenza vaccine: Not documented    Tetanus booster: 06/18/2003: historical per pt    Pneumococcal vaccine: Pneumovax  (11/30/2009)  Colorectal Screening   Hemoccult: Not documented    Colonoscopy: Not documented  Other Screening   Pap smear: Not documented    Mammogram: Not documented   Smoking status: never  (03/05/2010)  Diabetes Mellitus   HgbA1C: 8.0  (03/05/2010)   Hemoglobin A1C due: 06/04/2010    Eye exam: Not documented    Foot exam: yes  (03/05/2010)   Foot exam action/deferral: Do today   High risk foot: Not documented   Foot care education: Done  (03/05/2010)     Urine microalbumin/creatinine ratio: Not documented   Urine microalbumin action/deferral: Ordered   Urine microalbumin/cr due: 03/06/2011  Lipids   Total Cholesterol: 211  (10/17/2009)   LDL: 110  (10/17/2009)   LDL Direct: Not documented   HDL: 40  (10/17/2009)   Triglycerides: 306  (10/17/2009)    SGOT (AST): Not documented   SGPT (ALT): Not documented CMP ordered    Alkaline phosphatase: Not documented   Total bilirubin: Not documented  Hypertension   Last Blood Pressure: 110 / 72  (03/05/2010)   Serum creatinine: 0.45  (10/16/2009)   Serum potassium 3.4  (10/16/2009) CMP ordered   Self-Management Support :    Diabetes self-management support: Not documented    Hypertension self-management support: Not documented    Lipid self-management support: Not documented    Laboratory Results   Urine Tests    Routine Urinalysis   Color: brown Appearance: Hazy Glucose: negative   (Normal Range: Negative) Bilirubin: negative   (Normal Range: Negative) Ketone: negative   (Normal Range: Negative) Spec. Gravity: 1.015   (Normal Range: 1.003-1.035) Blood: small   (Normal Range: Negative) pH: 6.0   (Normal Range: 5.0-8.0) Protein: 30   (Normal Range: Negative) Urobilinogen: 1.0   (Normal Range: 0-1) Nitrite: negative   (Normal Range: Negative) Leukocyte Esterace: small   (Normal Range: Negative)     Blood Tests     HGBA1C: 8.0%   (Normal Range: Non-Diabetic - 3-6%   Control Diabetic - 6-8%) CBG Random:: 95mg /dL    DIRECTV KOH: Negative  Other Tests  Rapid HIV: negative  Stool - Occult Blood Hemmoccult #1: negative    Appended Document: Complete Physical Exam     Allergies: No Known Drug Allergies   Complete Medication List: 1)  Relion Ultra Thin Lancets 30g Misc (Lancets) 2)  Relion Ultima Test Strp (Glucose blood) 3)  Relion Confirm Glucose Monitor W/device Kit (Blood glucose monitoring suppl) 4)  Simvastatin 20 Mg Tabs  (Simvastatin) .... One tablet by mouth nightly for cholesterol 5)  Benazepril Hcl 5 Mg Tabs (Benazepril hcl) .... One tablet by mouth daily 6)  Glyburide 5 Mg Tabs (Glyburide) .... One tablet by mouth two times a day for diabetes **note change in dose** 7)  Glucophage 1000 Mg Tabs (Metformin hcl) .... One tablet by mouth two times a day 8)  Lantus 100 Unit/ml Soln (Insulin glargine) .... 30 units subcutaneously nightly 9)  Aspir-low 81 Mg Tbec (Aspirin) .... One tablet by mouth daily for circulation 10)  Ferrous Sulfate 325 (65 Fe) Mg Tabs (Ferrous sulfate) .... One tablet by mouth two times a day   Laboratory Results  Date/Time Received: March 19, 2010 5:29 PM  Stool - Occult Blood Hemmoccult #1: negative Date: 03/19/2010 Hemoccult #2: negative Date: 03/19/2010 Hemoccult #3: negative Date: 03/19/2010

## 2010-07-17 NOTE — Assessment & Plan Note (Signed)
Summary: Diabetes/HTN   Vital Signs:  Patient profile:   55 year old female Menstrual status:  hysterectomy Weight:      152.5 pounds Temp:     97.5 degrees F oral Pulse rate:   80 / minute Pulse rhythm:   regular Resp:     20 per minute BP sitting:   136 / 80  (left arm) Cuff size:   regular  Vitals Entered By: Levon Hedger (March 23, 2010 9:42 AM) CC: follow-up visit 2 weeks, Hypertension Management, Lipid Management Is Patient Diabetic? Yes Pain Assessment Patient in pain? no      CBG Result 116 CBG Device ID B  Does patient need assistance? Functional Status Self care Ambulation Normal   CC:  follow-up visit 2 weeks, Hypertension Management, and Lipid Management.  History of Present Illness:  Pt into the office for f/u on weakness, diabetes and htn.  No medications today in office with pt. Reviewed medication with pt - pharmacy review indicates that pt lasted picked up 02/07/2010  Diabetes Management History:      The patient is a 55 years old female who comes in for evaluation of Type 2 Diabetes Mellitus.  She has not been enrolled in the "Diabetic Education Program".  She states understanding of dietary principles and is following her diet appropriately.  No sensory loss is reported.  Self foot exams are not being performed.  She is checking home blood sugars.  She says that she is not exercising regularly.        Hypoglycemic symptoms are not occurring.  No hyperglycemic symptoms are reported.  Other comments include: she is checking her blood sugars at home.        No changes have been made to her treatment plan since last visit.    Hypertension History:      She denies headache, chest pain, and palpitations.  She notes no problems with any antihypertensive medication side effects.        Positive major cardiovascular risk factors include diabetes, hyperlipidemia, and hypertension.  Negative major cardiovascular risk factors include female age less than 14  years old and non-tobacco-user status.        Further assessment for target organ damage reveals no history of ASHD, cardiac end-organ damage (CHF/LVH), stroke/TIA, peripheral vascular disease, renal insufficiency, or hypertensive retinopathy.    Lipid Management History:      Positive NCEP/ATP III risk factors include diabetes and hypertension.  Negative NCEP/ATP III risk factors include female age less than 58 years old, no history of early menopause without estrogen hormone replacement, non-tobacco-user status, no ASHD (atherosclerotic heart disease), no prior stroke/TIA, no peripheral vascular disease, and no history of aortic aneurysm.        The patient states that she does not know about the "Therapeutic Lifestyle Change" diet.  The patient does not know about adjunctive measures for cholesterol lowering.  She expresses no side effects from her lipid-lowering medication.  The patient denies any symptoms to suggest myopathy or liver disease.       Allergies (verified): No Known Drug Allergies  Review of Systems General:  Denies fever and weakness. CV:  Denies chest pain or discomfort. Resp:  Denies cough. GI:  Denies abdominal pain, nausea, and vomiting.  Physical Exam  General:  alert.   Head:  normocephalic.   Lungs:  normal breath sounds.   Heart:  normal rate and regular rhythm.   Msk:  bunion.  bilaterally Neurologic:  alert & oriented  X3.   Skin:  color normal.   Psych:  Oriented X3.    Diabetes Management Exam:    Foot Exam (with socks and/or shoes not present):       Sensory-Monofilament:          Left foot: normal          Right foot: normal       Nails:          Left foot: thickened          Right foot: thickened   Impression & Recommendations:  Problem # 1:  DIABETES MELLITUS, UNCONTROLLED (ICD-250.02) last hgba1c = 8.0 advised pt to take meds as ordered Her updated medication list for this problem includes:    Benazepril Hcl 5 Mg Tabs (Benazepril hcl)  ..... One tablet by mouth daily    Glyburide 5 Mg Tabs (Glyburide) ..... One tablet by mouth two times a day for diabetes **note change in dose**    Glucophage 1000 Mg Tabs (Metformin hcl) ..... One tablet by mouth two times a day    Lantus 100 Unit/ml Soln (Insulin glargine) .Marland KitchenMarland KitchenMarland KitchenMarland Kitchen 30 units subcutaneously nightly    Aspir-low 81 Mg Tbec (Aspirin) ..... One tablet by mouth daily for circulation  Problem # 2:  HYPERLIPIDEMIA (ICD-272.4)  will check lipids today Her updated medication list for this problem includes:    Simvastatin 20 Mg Tabs (Simvastatin) ..... One tablet by mouth nightly for cholesterol  Orders: T-Lipid Profile ([id number])  Problem # 3:  HYPERTENSION, BENIGN ESSENTIAL (ICD-401.1)  Her updated medication list for this problem includes:    Benazepril Hcl 5 Mg Tabs (Benazepril hcl) ..... One tablet by mouth daily  Problem # 4:  NEED PROPHYLACTIC VACCINATION&INOCULATION FLU (ICD-V04.81) given today in office  Complete Medication List: 1)  Relion Ultra Thin Lancets 30g Misc (Lancets) 2)  Relion Ultima Test Strp (Glucose blood) 3)  Relion Confirm Glucose Monitor W/device Kit (Blood glucose monitoring suppl) 4)  Simvastatin 20 Mg Tabs (Simvastatin) .... One tablet by mouth nightly for cholesterol 5)  Benazepril Hcl 5 Mg Tabs (Benazepril hcl) .... One tablet by mouth daily 6)  Glyburide 5 Mg Tabs (Glyburide) .... One tablet by mouth two times a day for diabetes **note change in dose** 7)  Glucophage 1000 Mg Tabs (Metformin hcl) .... One tablet by mouth two times a day 8)  Lantus 100 Unit/ml Soln (Insulin glargine) .... 30 units subcutaneously nightly 9)  Aspir-low 81 Mg Tbec (Aspirin) .... One tablet by mouth daily for circulation 10)  Ferrous Sulfate 325 (65 Fe) Mg Tabs (Ferrous sulfate) .... One tablet by mouth two times a day  Other Orders: Capillary Blood Glucose/CBG (14782) Flu Vaccine 73yrs + [phone]) Admin 1st Vaccine (30865) Admin 1st Vaccine [facility])  [phone])  Diabetes Management Assessment/Plan:      The following lipid goals have been established for the patient: Total cholesterol goal of 200; LDL cholesterol goal of 100; HDL cholesterol goal of 40; Triglyceride goal of 150.  Her blood pressure goal is < 130/80.    Hypertension Assessment/Plan:      The patient's hypertensive risk group is category C: Target organ damage and/or diabetes.  Her calculated 10 year risk of coronary heart disease is 17 %.  Today's blood pressure is 136/80.  Her blood pressure goal is < 130/80.  Lipid Assessment/Plan:      Based on NCEP/ATP III, the patient's risk factor category is "history of diabetes".  The patient's lipid goals are as follows: Total  cholesterol goal is 200; LDL cholesterol goal is 100; HDL cholesterol goal is 40; Triglyceride goal is 150.    Patient Instructions: 1)  Diabetes - You should be taking glyburide 5mg  by mouth two times a day.  You should also be taking metformin 1000mg  by mouth two times a day daily. 2)  Also continue your current dose of insulin 3)  You have been given the flu vaccine today. 4)  Your cholesterol should be checked today and you will be notified of the results. 5)  Follow up in 3 months with n.martin,fnp for diabetes 6)  will given cbg, u/a, hgba1c. Prescriptions: LANTUS 100 UNIT/ML SOLN (INSULIN GLARGINE) 30 units subcutaneously nightly  #1 month qs x 5   Entered and Authorized by:   Lehman Prom FNP   Signed by:   Lehman Prom FNP on 03/23/2010   Method used:   Faxed to ...       [facility] - [facility] (retail)       [address] [place], [place]  [address]       Ph: [phone]       Fax: [phone]   RxID:   [id number] GLUCOPHAGE 1000 MG TABS (METFORMIN HCL) One tablet by mouth two times a day  #60 x 5   Entered and Authorized by:   Lehman Prom FNP   Signed by:   Lehman Prom FNP on 03/23/2010   Method used:   Faxed to ...       [facility] - [facility] (retail)       [address] [place], [place]  [address]       Ph: [phone]       Fax: [phone]   RxID:   [id number] GLYBURIDE 5 MG TABS (GLYBURIDE) One tablet by mouth two times a day for diabetes **note change in dose**  #60 x 5   Entered and Authorized by:   Lehman Prom FNP   Signed by:   Lehman Prom FNP on 03/23/2010   Method used:   Faxed to ...       [facility] - Pharmac (retail)       [address] [place], [place]  [address]       Ph: [phone]       Fax: [phone]   RxID:   [id number] BENAZEPRIL HCL 5 MG TABS (BENAZEPRIL HCL) One tablet by mouth daily  #30 x 5   Entered and Authorized by:   Lehman Prom FNP   Signed by:   Lehman Prom FNP on 03/23/2010   Method used:   Faxed to ...       [facility] - Pharmac (retail)       [address] [place], [place]  [address]       Ph: [phone]       Fax: [phone]   RxID:   [id number] SIMVASTATIN 20 MG TABS (SIMVASTATIN) One tablet by mouth nightly for cholesterol  #30 x 5   Entered and Authorized by:   Lehman Prom FNP   Signed by:   Lehman Prom FNP on 03/23/2010   Method used:   Faxed to ...       [facility] - [facility] (retail)       [address]  [place], [place]  [address]       Ph: [phone]       Fax: [phone]   RxID:   [id number]   Diabetic Foot Exam Foot Inspection Is there a history of a foot ulcer?              No Is there a foot ulcer now?              No Can the patient see the bottom of their feet?          Yes Are the shoes appropriate in style and fit?          Yes Is there swelling or an abnormal foot shape?          No Are the toenails long?                No Are the toenails thick?                Yes Are the toenails ingrown?              No Is there heavy callous build-up?               No Is there pain in the calf muscle (Intermittent claudication) when walking?    NoIs there a claw toe deformity?              No Is there elevated skin temperature?            No Is there limited ankle dorsiflexion?            No Is there foot or ankle muscle weakness?            No  Diabetic Foot Care Education Patient educated on appropriate care of diabetic feet.  Pulse Check          Right Foot          Left Foot Dorsalis Pedis:        normal            normal    10-g (5.07) Semmes-Weinstein Monofilament Test Performed by: Levon Hedger          Right Foot          Left Foot Visual Inspection                Laboratory Results   Blood Tests     CBG Random:: 116       Influenza Vaccine    Vaccine Type: Fluvax 3+    Site: left deltoid    Mfr: GlaxoSmithKline    Dose: 0.5 ml    Route: IM    Given by: Levon Hedger    Exp. Date: 11/2010    Lot #: [id number]    VIS given: 01/09/10 version given March 23, 2010.  Flu Vaccine Consent Questions    Do you have a history of severe allergic reactions to this vaccine? no    Any prior history of allergic reactions to egg and/or gelatin? no    Do you have a sensitivity to the preservative Thimersol? no    Do you have a past history of Guillan-Barre Syndrome? no    Do you currently have an acute febrile illness? no    Have you ever had a severe reaction to latex? no    Vaccine information given and explained to patient? yes    Are you currently pregnant? no   Last LDL:  110 (10/17/2009 2:57:14 PM)          Diabetic Foot Exam Diabetic Foot Care Education :Patient educated on appropriate care of diabetic feet.  Pulse Check          Right Foot          Left Foot Dorsalis Pedis:        normal            normal    10-g (5.07) Semmes-Weinstein Monofilament Test Performed by: Levon Hedger          Right Foot          Left Foot Visual Inspection                Test Control      normal         normal Site 1         normal         normal Site 2         normal         normal Site 3         normal         normal Site 4         normal         normal Site 5         normal         normal Site 6         normal         normal Site 7         normal         normal Site 8         normal         normal Site 9         normal         normal Site 10         normal         normal  Impression      normal         normal   Prevention & Chronic Care Immunizations   Influenza vaccine: Fluvax 3+  (03/23/2010)    Tetanus booster: 06/18/2003: historical per pt    Pneumococcal vaccine: Pneumovax  (11/30/2009)  Colorectal Screening   Hemoccult: Not documented    Colonoscopy: Not documented  Other Screening   Pap smear:  Specimen Adequacy: Satisfactory for evaluation.   Interpretation/Result:Negative for intraepithelial Lesion or Malignancy.     (03/05/2010)   Pap smear due: 03/2011    Mammogram: ASSESSMENT: Negative - BI-RADS 1^MM DIGITAL SCREENING  (03/16/2010)   Smoking status: never  (03/05/2010)  Diabetes Mellitus   HgbA1C: 8.0  (03/05/2010)   Hemoglobin A1C due: 06/04/2010    Eye exam: Not documented    Foot exam: yes  (03/23/2010)   Foot exam action/deferral: Do today   High risk foot: Not documented   Foot care education: Done  (03/23/2010)    Urine microalbumin/creatinine ratio: Not documented   Urine microalbumin action/deferral: Ordered   Urine microalbumin/cr due: 03/06/2011  Lipids   Total Cholesterol: 211  (10/17/2009)   LDL: 110  (10/17/2009)   LDL Direct: Not documented   HDL: 40  (10/17/2009)   Triglycerides: 306  (10/17/2009)    SGOT (AST): 33  (03/05/2010)   SGPT (ALT): 40  (03/05/2010)   Alkaline phosphatase: 56  (03/05/2010)   Total bilirubin: 0.3  (03/05/2010)  Hypertension   Last Blood Pressure: 136 /  80  (03/23/2010)   Serum creatinine: 0.57  (03/05/2010)   Serum potassium 4.5   (03/05/2010)  Self-Management Support :    Diabetes self-management support: Not documented    Hypertension self-management support: Not documented    Lipid self-management support: Not documented    Nursing Instructions: Give Flu vaccine today

## 2010-07-17 NOTE — Progress Notes (Signed)
Summary: LETTER FOR MED CHANGE   Phone Note Call from Patient   Caller: Patient Reason for Call: Talk to Nurse Summary of Call: PT GOT A LETTER IN THE MAIL TATING ABOUT MEDS CHANGE WANTS TO KNOW  WHICH MEDS IS THAT  Initial call taken by: Oscar La,  March 16, 2010 9:42 AM  Follow-up for Phone Call        spoke with pt and informed her of needing to take a iron supplement (ferrous sulfate) .  Pt states she has an appt on Friday  03/23/10. Follow-up by: Levon Hedger,  March 19, 2010 11:05 AM

## 2010-07-17 NOTE — Letter (Signed)
Summary: Lipid Letter  [facility]  [address] [place], [place] [address]   Phone: [phone]  Fax: [phone]    03/26/2010  Michelle Melendez [address] [place], [place]  [address]  Dear Steward Drone:  We have carefully reviewed your last lipid profile from 03/23/2010 and the results are noted below with a summary of recommendations for lipid management.    Cholesterol:       198     Goal: less than 200   HDL "good" Cholesterol:   44     Goal: greater than 40   LDL "bad" Cholesterol:   134     Goal: less than 70   Triglycerides:       98     Goal: less than 150     Your "Bad cholesterol" is still slightly elevated.  Continue to take medication as ordered.  Also avoid fried fatty foods.  Bone density results are normal.     Current Medications: 1)    Relion Ultra Thin Lancets 30g  Misc (Lancets) 2)    Relion Ultima Test  Strp (Glucose blood) 3)    Relion Confirm Glucose Monitor W/device Kit (Blood glucose monitoring suppl) 4)    Simvastatin 20 Mg Tabs (Simvastatin) .... One tablet by mouth nightly for cholesterol 5)    Benazepril Hcl 5 Mg Tabs (Benazepril hcl) .... One tablet by mouth daily 6)    Glyburide 5 Mg Tabs (Glyburide) .... One tablet by mouth two times a day for diabetes **note change in dose** 7)    Glucophage 1000 Mg Tabs (Metformin hcl) .... One tablet by mouth two times a day 8)    Lantus 100 Unit/ml Soln (Insulin glargine) .... 30 units subcutaneously nightly 9)    Aspir-low 81 Mg Tbec (Aspirin) .... One tablet by mouth daily for circulation 10)    Ferrous Sulfate 325 (65 Fe) Mg Tabs (Ferrous sulfate) .... One tablet by mouth two times a day  If you have any questions, please call. We appreciate being able to work with you.   Sincerely,    [facility] Lehman Prom FNP

## 2010-07-19 ENCOUNTER — Ambulatory Visit: Admit: 2010-07-19 | Payer: Self-pay | Attending: Internal Medicine | Admitting: Internal Medicine

## 2010-09-04 LAB — POCT CARDIAC MARKERS
CKMB, poc: <1 ng/mL — ABNORMAL LOW (ref 1.0–8.0)
Myoglobin, poc: 43.3 ng/mL (ref 12–200)
Troponin i, poc: <0.05 ng/mL (ref 0.00–0.09)

## 2010-09-04 LAB — CBC
MCHC: 32.9 g/dL (ref 30.0–36.0)
MCV: 81.8 fL (ref 78.0–100.0)
Platelets: 192 10*3/uL (ref 150–400)
RBC: 4.57 MIL/uL (ref 3.87–5.11)
RDW: 12.8 % (ref 11.5–15.5)

## 2010-09-04 LAB — URINALYSIS, ROUTINE W REFLEX MICROSCOPIC
Glucose, UA: >1000 mg/dL — AB
Ketones, ur: 40 mg/dL — AB
Leukocytes, UA: NEGATIVE
pH: 5.5 (ref 5.0–8.0)

## 2010-09-04 LAB — BASIC METABOLIC PANEL
BUN: 9 mg/dL (ref 6–23)
Calcium: 8.1 mg/dL — ABNORMAL LOW (ref 8.4–10.5)
Chloride: 102 mEq/L (ref 96–112)
GFR calc Af Amer: >60 mL/min (ref >60)
GFR calc non Af Amer: >60 mL/min (ref >60)
Glucose, Bld: 248 mg/dL — ABNORMAL HIGH (ref 70–99)
Glucose, Bld: 380 mg/dL — ABNORMAL HIGH (ref 70–99)
Potassium: 3.5 mEq/L (ref 3.5–5.1)
Sodium: 136 mEq/L (ref 135–145)

## 2010-09-04 LAB — COMPREHENSIVE METABOLIC PANEL
AST: 13 U/L (ref 0–37)
CO2: 19 mEq/L (ref 19–32)
Calcium: 8.7 mg/dL (ref 8.4–10.5)
Creatinine, Ser: 0.75 mg/dL (ref 0.4–1.2)
GFR calc Af Amer: >60 mL/min (ref >60)
GFR calc non Af Amer: >60 mL/min (ref >60)
Total Protein: 6.9 g/dL (ref 6.0–8.3)

## 2010-09-04 LAB — GLUCOSE, CAPILLARY
Glucose-Capillary: 484 mg/dL — ABNORMAL HIGH (ref 70–99)
Glucose-Capillary: >600 mg/dL (ref 70–99)

## 2010-09-04 LAB — DIFFERENTIAL
Eosinophils Relative: 0 % (ref 0–5)
Lymphocytes Relative: 29 % (ref 12–46)
Lymphs Abs: 1.5 10*3/uL (ref 0.7–4.0)

## 2010-09-04 LAB — LIPID PANEL
Cholesterol: 211 mg/dL — ABNORMAL HIGH (ref 0–200)
HDL: 40 mg/dL (ref >39)
Total CHOL/HDL Ratio: 5.3 RATIO

## 2010-09-04 LAB — URINE MICROSCOPIC-ADD ON

## 2011-03-14 LAB — POCT I-STAT, CHEM 8
Chloride: 100
Glucose, Bld: 180 — ABNORMAL HIGH
HCT: 37
Hemoglobin: 12.6
Potassium: 3.1 — ABNORMAL LOW
Sodium: 134 — ABNORMAL LOW

## 2011-03-14 LAB — DIFFERENTIAL
Basophils Relative: 1
Eosinophils Absolute: 0
Lymphs Abs: 1.8
Monocytes Absolute: 0.4
Monocytes Relative: 8
Neutro Abs: 2.7
Neutrophils Relative %: 55

## 2011-03-14 LAB — URINALYSIS, ROUTINE W REFLEX MICROSCOPIC
Nitrite: NEGATIVE
Specific Gravity, Urine: 1.007
Urobilinogen, UA: 2 — ABNORMAL HIGH
pH: 6.5

## 2011-03-14 LAB — POCT CARDIAC MARKERS
CKMB, poc: <1 — ABNORMAL LOW
Myoglobin, poc: 54.5

## 2011-03-14 LAB — CBC
Hemoglobin: 11.7 — ABNORMAL LOW
MCHC: 32.7
MCV: 78.6
RBC: 4.53
WBC: 4.9

## 2011-03-14 LAB — URINE MICROSCOPIC-ADD ON

## 2012-09-17 ENCOUNTER — Emergency Department (HOSPITAL_COMMUNITY): Payer: No Typology Code available for payment source

## 2012-09-17 ENCOUNTER — Encounter (HOSPITAL_COMMUNITY): Payer: Self-pay | Admitting: Emergency Medicine

## 2012-09-17 ENCOUNTER — Emergency Department (HOSPITAL_COMMUNITY)
Admission: EM | Admit: 2012-09-17 | Discharge: 2012-09-18 | Disposition: A | Discharge status: Home / Self Care | Payer: No Typology Code available for payment source | Attending: Emergency Medicine | Admitting: Emergency Medicine

## 2012-09-17 DIAGNOSIS — I10 Essential (primary) hypertension: Secondary | ICD-10-CM | POA: Insufficient documentation

## 2012-09-17 DIAGNOSIS — F29 Unspecified psychosis not due to a substance or known physiological condition: Secondary | ICD-10-CM | POA: Insufficient documentation

## 2012-09-17 DIAGNOSIS — S0003XA Contusion of scalp, initial encounter: Secondary | ICD-10-CM | POA: Insufficient documentation

## 2012-09-17 DIAGNOSIS — R000 Tachycardia, unspecified: Secondary | ICD-10-CM | POA: Insufficient documentation

## 2012-09-17 DIAGNOSIS — Y92410 Unspecified street and highway as the place of occurrence of the external cause: Secondary | ICD-10-CM | POA: Insufficient documentation

## 2012-09-17 DIAGNOSIS — S0093XA Contusion of unspecified part of head, initial encounter: Secondary | ICD-10-CM

## 2012-09-17 DIAGNOSIS — IMO0001 Reserved for inherently not codable concepts without codable children: Secondary | ICD-10-CM | POA: Insufficient documentation

## 2012-09-17 DIAGNOSIS — Y9389 Activity, other specified: Secondary | ICD-10-CM | POA: Insufficient documentation

## 2012-09-17 HISTORY — DX: Type 2 diabetes mellitus without complications: E11.9

## 2012-09-17 HISTORY — DX: Essential (primary) hypertension: I10

## 2012-09-17 MED ORDER — MORPHINE SULFATE 4 MG/ML IJ SOLN
4.0000 mg | Freq: Once | INTRAMUSCULAR | Status: AC
  Administered 2012-09-17: 4 mg via INTRAVENOUS
  Filled 2012-09-17: qty 1

## 2012-09-17 MED ORDER — SODIUM CHLORIDE 0.9 % IV BOLUS (SEPSIS)
1000.0000 mL | Freq: Once | INTRAVENOUS | Status: AC
  Administered 2012-09-17: 1000 mL via INTRAVENOUS
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
## 2016-07-29 ENCOUNTER — Other Ambulatory Visit: Payer: Self-pay | Admitting: Nurse Practitioner

## 2016-07-29 DIAGNOSIS — Z1231 Encounter for screening mammogram for malignant neoplasm of breast: Secondary | ICD-10-CM

## 2016-08-13 ENCOUNTER — Ambulatory Visit
Admission: RE | Admit: 2016-08-13 | Discharge: 2016-08-13 | Disposition: A | Discharge status: Home / Self Care | Payer: BLUE CROSS/BLUE SHIELD | Source: Ambulatory Visit | Attending: Nurse Practitioner | Admitting: Nurse Practitioner

## 2016-08-13 DIAGNOSIS — Z1231 Encounter for screening mammogram for malignant neoplasm of breast: Secondary | ICD-10-CM

## 2016-08-27 ENCOUNTER — Other Ambulatory Visit: Payer: Self-pay | Admitting: Gastroenterology

## 2016-08-27 ENCOUNTER — Ambulatory Visit
Admission: RE | Admit: 2016-08-27 | Discharge: 2016-08-27 | Disposition: A | Discharge status: Home / Self Care | Payer: BLUE CROSS/BLUE SHIELD | Source: Ambulatory Visit | Attending: Gastroenterology | Admitting: Gastroenterology

## 2016-08-27 DIAGNOSIS — K5909 Other constipation: Secondary | ICD-10-CM

## 2020-09-25 ENCOUNTER — Other Ambulatory Visit: Payer: Self-pay

## 2020-09-25 ENCOUNTER — Encounter (HOSPITAL_COMMUNITY): Payer: Self-pay

## 2020-09-25 ENCOUNTER — Emergency Department (HOSPITAL_COMMUNITY)
Admission: EM | Admit: 2020-09-25 | Discharge: 2020-09-25 | Disposition: A | Discharge status: Home / Self Care | Payer: 59 | Attending: Emergency Medicine | Admitting: Emergency Medicine

## 2020-09-25 DIAGNOSIS — Z7984 Long term (current) use of oral hypoglycemic drugs: Secondary | ICD-10-CM | POA: Insufficient documentation

## 2020-09-25 DIAGNOSIS — R110 Nausea: Secondary | ICD-10-CM

## 2020-09-25 DIAGNOSIS — Z79899 Other long term (current) drug therapy: Secondary | ICD-10-CM | POA: Diagnosis not present

## 2020-09-25 DIAGNOSIS — Z7982 Long term (current) use of aspirin: Secondary | ICD-10-CM | POA: Insufficient documentation

## 2020-09-25 DIAGNOSIS — E1169 Type 2 diabetes mellitus with other specified complication: Secondary | ICD-10-CM | POA: Insufficient documentation

## 2020-09-25 DIAGNOSIS — E785 Hyperlipidemia, unspecified: Secondary | ICD-10-CM | POA: Diagnosis not present

## 2020-09-25 DIAGNOSIS — Z87891 Personal history of nicotine dependence: Secondary | ICD-10-CM | POA: Diagnosis not present

## 2020-09-25 DIAGNOSIS — R9431 Abnormal electrocardiogram [ECG] [EKG]: Secondary | ICD-10-CM | POA: Diagnosis not present

## 2020-09-25 LAB — CBC
HCT: 37.2 % (ref 36.0–46.0)
Hemoglobin: 11.7 g/dL — ABNORMAL LOW (ref 12.0–15.0)
MCH: 26.6 pg (ref 26.0–34.0)
MCHC: 31.5 g/dL (ref 30.0–36.0)
MCV: 84.5 fL (ref 80.0–100.0)
Platelets: 240 10*3/uL (ref 150–400)
RBC: 4.4 MIL/uL (ref 3.87–5.11)
RDW: 13.5 % (ref 11.5–15.5)
WBC: 7.1 10*3/uL (ref 4.0–10.5)
nRBC: 0 % (ref 0.0–0.2)

## 2020-09-25 LAB — BASIC METABOLIC PANEL
Anion gap: 9 (ref 5–15)
BUN: 13 mg/dL (ref 8–23)
CO2: 26 mmol/L (ref 22–32)
Calcium: 9.3 mg/dL (ref 8.9–10.3)
Chloride: 100 mmol/L (ref 98–111)
Creatinine, Ser: 0.63 mg/dL (ref 0.44–1.00)
GFR, Estimated: >60 mL/min (ref >60)
Glucose, Bld: 330 mg/dL — ABNORMAL HIGH (ref 70–99)
Potassium: 3.3 mmol/L — ABNORMAL LOW (ref 3.5–5.1)
Sodium: 135 mmol/L (ref 135–145)

## 2020-09-25 LAB — TROPONIN I (HIGH SENSITIVITY): Troponin I (High Sensitivity): 2 ng/L (ref <18)

## 2020-09-25 NOTE — ED Notes (Signed)
No need to draw 2nd troponin per Dr. Rubin Payor.

## 2020-09-25 NOTE — Discharge Instructions (Addendum)
Your EKG is reassuring today.  Follow-up with your doctors for further evaluation

## 2020-09-25 NOTE — ED Provider Notes (Signed)
[facility] EMERGENCY DEPARTMENT Provider Note   CSN: [id number] Arrival date & time: 09/25/20  1042     History Chief Complaint  Patient presents with  . Abnormal ECG    Michelle Melendez is a 65 y.o. female.  HPI Patient sent in by urgent care.  Reportedly was there for her normal wellness checkup.  Patient states that she took all her medicines morning without eating and that she is feeling a little nauseous because of it.  States they did an EKG at the urgent care and sent her in because it was abnormal.  Report is that it was irregular but there are 2 different complexes circled on the EKG that appears.  They are thinking more of ST changes.  Patient not having chest pain.  States the nausea is improved but still mildly there.  No known history of coronary artery disease.  No problems with exertion.    Past Medical History:  Diagnosis Date  . Diabetes mellitus without complication (HCC)   . Hypertension     Patient Active Problem List   Diagnosis Date Noted  . DIABETES MELLITUS, UNCONTROLLED 11/02/2009  . HYPERLIPIDEMIA 11/02/2009  . HYPERTENSION, BENIGN ESSENTIAL 11/02/2009  . MENOPAUSE, SURGICAL 06/17/1998    Past Surgical History:  Procedure Laterality Date  . ABDOMINAL HYSTERECTOMY       OB History   No obstetric history on file.     No family history on file.  Social History   Tobacco Use  . Smoking status: Former Games developer  . Smokeless tobacco: Never Used  . Tobacco comment: quit 2004  Substance Use Topics  . Alcohol use: Yes    Alcohol/week: 6.0 standard drinks    Types: 6 Cans of beer per week  . Drug use: Never    Home Medications Prior to Admission medications   Medication Sig Start Date End Date Taking? Authorizing Provider  aspirin EC 81 MG tablet Take 81 mg by mouth daily. Swallow whole.   Yes [provider]  gabapentin (NEURONTIN) 300 MG capsule Take 300 mg by mouth 3 (three) times daily.   Yes [provider]  glyBURIDE (DIABETA) 5 MG tablet Take 5 mg by mouth daily with breakfast.   Yes [provider]  metFORMIN (GLUCOPHAGE) 500 MG tablet Take 1,000 mg by mouth 2 (two) times daily with a meal.   Yes [provider]  simvastatin (ZOCOR) 80 MG tablet Take 80 mg by mouth daily.   Yes [provider]  amLODipine (NORVASC) 5 MG tablet Take 5 mg by mouth daily. Patient not taking: No sig reported    [provider]  benazepril (LOTENSIN) 10 MG tablet Take 10 mg by mouth daily. Patient not taking: No sig reported    [provider]    Allergies    Patient has no known allergies.  Review of Systems   Review of Systems  Constitutional: Negative for appetite change.  HENT: Negative for congestion.   Respiratory: Negative for shortness of breath.   Cardiovascular: Negative for chest pain.  Gastrointestinal: Positive for nausea.  Genitourinary: Negative for flank pain.  Musculoskeletal: Negative for gait problem.  Skin: Negative for rash.  Neurological: Negative for weakness.  Psychiatric/Behavioral: Negative for confusion.    Physical Exam Updated Vital Signs BP (!) 158/77   Pulse 90   Temp 98.7 F (37.1 C) (Oral)   Resp 15   Ht 5' (1.524 m)   Wt 68 kg   SpO2 96%  BMI 29.29 kg/m   Physical Exam Vitals and nursing note reviewed.  HENT:     Head: Atraumatic.  Eyes:     Pupils: Pupils are equal, round, and reactive to light.  Cardiovascular:     Rate and Rhythm: Regular rhythm.  Pulmonary:     Breath sounds: Normal breath sounds.  Abdominal:     General: There is no distension.  Musculoskeletal:        General: No tenderness.     Cervical back: Neck supple.     Right lower leg: No edema.     Left lower leg: No edema.  Skin:    General: Skin is warm.     Capillary Refill: Capillary refill takes less than 2 seconds.  Neurological:     Mental Status: She is alert and oriented to person, place, and time.     ED  Results / Procedures / Treatments   Labs (all labs ordered are listed, but only abnormal results are displayed) Labs Reviewed  BASIC METABOLIC PANEL - Abnormal; Notable for the following components:      Result Value   Potassium 3.3 (*)    Glucose, Bld 330 (*)    All other components within normal limits  CBC - Abnormal; Notable for the following components:   Hemoglobin 11.7 (*)    All other components within normal limits  TROPONIN I (HIGH SENSITIVITY)  TROPONIN I (HIGH SENSITIVITY)    EKG EKG Interpretation  Date/Time:  Monday September 25 2020 11:15:56 EDT Ventricular Rate:  76 PR Interval:  155 QRS Duration: 85 QT Interval:  454 QTC Calculation: 511 R Axis:   25 Text Interpretation: Sinus arrhythmia Prolonged QT interval Confirmed by Benjiman Core [phone]) on 09/25/2020 12:11:51 PM   Radiology No results found.  Procedures Procedures   Medications Ordered in ED Medications - No data to display  ED Course  I have reviewed the triage vital signs and the nursing notes.  Pertinent labs & imaging results that were available during my care of the patient were reviewed by me and considered in my medical decision making (see chart for details).    MDM Rules/Calculators/A&P                          Patient sent in from urgent care.  Reportedly only complaint was some nauseousness.  States it is from taking all her medicines once morning.  Reportedly sent in for abnormal EKG.  Did have some nonspecific changes on the twelve-lead but not really that worrisome.  Troponin done due to nausea to evaluate if this could have been an anginal equivalent.  Troponin negative.  Lab work reassuring.  Discharge home. Final Clinical Impression(s) / ED Diagnoses Final diagnoses:  Nausea    Rx / DC Orders ED Discharge Orders    None       Benjiman Core, MD 09/27/20 [phone]

## 2020-09-25 NOTE — ED Triage Notes (Signed)
BIB EMS from UC for abnormal EKG. Was at [facility] for wellness check up. Sinus arrhythmia (normal for pt) 18rr, 148/86, 97.4 temp, 98% on room air. No pain.

## 2020-11-29 ENCOUNTER — Other Ambulatory Visit: Payer: Self-pay | Admitting: Family Medicine

## 2020-11-29 DIAGNOSIS — Z1231 Encounter for screening mammogram for malignant neoplasm of breast: Secondary | ICD-10-CM

## 2021-01-29 ENCOUNTER — Ambulatory Visit
Admission: RE | Admit: 2021-01-29 | Discharge: 2021-01-29 | Disposition: A | Discharge status: Home / Self Care | Payer: 59 | Source: Ambulatory Visit | Attending: Family Medicine | Admitting: Family Medicine

## 2021-01-29 ENCOUNTER — Other Ambulatory Visit: Payer: Self-pay

## 2021-01-29 DIAGNOSIS — Z1231 Encounter for screening mammogram for malignant neoplasm of breast: Secondary | ICD-10-CM

## 2021-04-02 ENCOUNTER — Emergency Department (HOSPITAL_COMMUNITY)
Admission: EM | Admit: 2021-04-02 | Discharge: 2021-04-03 | Disposition: A | Discharge status: Home / Self Care | Payer: 59 | Attending: Emergency Medicine | Admitting: Emergency Medicine

## 2021-04-02 ENCOUNTER — Emergency Department (HOSPITAL_COMMUNITY): Payer: 59

## 2021-04-02 DIAGNOSIS — Z5321 Procedure and treatment not carried out due to patient leaving prior to being seen by health care provider: Secondary | ICD-10-CM | POA: Insufficient documentation

## 2021-04-02 DIAGNOSIS — M25511 Pain in right shoulder: Secondary | ICD-10-CM | POA: Insufficient documentation

## 2021-04-02 DIAGNOSIS — R000 Tachycardia, unspecified: Secondary | ICD-10-CM | POA: Insufficient documentation

## 2021-04-02 DIAGNOSIS — R079 Chest pain, unspecified: Secondary | ICD-10-CM | POA: Diagnosis not present

## 2021-04-02 LAB — BASIC METABOLIC PANEL
Anion gap: 13 (ref 5–15)
BUN: 10 mg/dL (ref 8–23)
CO2: 26 mmol/L (ref 22–32)
Calcium: 9.6 mg/dL (ref 8.9–10.3)
Chloride: 94 mmol/L — ABNORMAL LOW (ref 98–111)
Creatinine, Ser: 0.65 mg/dL (ref 0.44–1.00)
GFR, Estimated: >60 mL/min (ref >60)
Glucose, Bld: 326 mg/dL — ABNORMAL HIGH (ref 70–99)
Potassium: 3.5 mmol/L (ref 3.5–5.1)
Sodium: 133 mmol/L — ABNORMAL LOW (ref 135–145)

## 2021-04-02 LAB — CBC
HCT: 37.7 % (ref 36.0–46.0)
Hemoglobin: 11.9 g/dL — ABNORMAL LOW (ref 12.0–15.0)
MCH: 26 pg (ref 26.0–34.0)
MCHC: 31.6 g/dL (ref 30.0–36.0)
MCV: 82.3 fL (ref 80.0–100.0)
Platelets: 270 10*3/uL (ref 150–400)
RBC: 4.58 MIL/uL (ref 3.87–5.11)
RDW: 12.6 % (ref 11.5–15.5)
WBC: 6 10*3/uL (ref 4.0–10.5)
nRBC: 0 % (ref 0.0–0.2)

## 2021-04-02 LAB — TROPONIN I (HIGH SENSITIVITY)
Troponin I (High Sensitivity): 4 ng/L (ref <18)
Troponin I (High Sensitivity): 4 ng/L (ref <18)

## 2021-04-02 MED ORDER — ACETAMINOPHEN 325 MG PO TABS
650.0000 mg | ORAL_TABLET | Freq: Once | ORAL | Status: AC
  Administered 2021-04-02: 650 mg via ORAL
  Filled 2021-04-02: qty 2

## 2021-04-02 MED ORDER — IBUPROFEN 800 MG PO TABS
800.0000 mg | ORAL_TABLET | Freq: Once | ORAL | Status: AC
  Administered 2021-04-02: 800 mg via ORAL
  Filled 2021-04-02: qty 2

## 2021-04-02 NOTE — ED Triage Notes (Signed)
Pt. Stated, Ive had rt. Shoulder pain that goes to my back and chest for the last 2 weeks.

## 2021-04-02 NOTE — ED Provider Notes (Signed)
Emergency Medicine Provider Triage Evaluation Note  Michelle Melendez , a 65 y.o. female  was evaluated in triage.  Pt complains of right-sided chest, shoulder, arm pain for the last 2 weeks, patient does report that she has had increased use of the right arm at work, increased scrubbing, patient reports that the pain is shooting in nature, associated with activity, somewhat relieved by her gabapentin.  Patient additionally presents with tachycardia, elevated blood pressure despite taking her normal blood pressure medications.  Patient denies history of fast heartbeat at rest, does report that she has had "irregular heartbeat".  Patient denies shortness of breath, nausea, vomiting, history of previous acute coronary syndrome, however patient is diabetic.  Review of Systems  Positive: Chest pain, arm pain, tachycardia Negative: Shortness of breath, nausea, vomiting, diaphoresis  Physical Exam  BP (!) 202/96 (BP Location: Left Arm)   Pulse (!) 125   Temp 98.8 F (37.1 C) (Oral)   Resp 20   SpO2 97%  Gen:   Awake, no distress   Resp:  Normal effort  MSK:   Moves extremities without difficulty  Other:  Tenderness to palpation of the right shoulder blade, right humeral head, right shaft of humerus, and right chest wall  Medical Decision Making  Medically screening exam initiated at 10:16 AM.  Appropriate orders placed.  Michelle Melendez was informed that the remainder of the evaluation will be completed by another provider, this initial triage assessment does not replace that evaluation, and the importance of remaining in the ED until their evaluation is complete.  MSK pain, tachycardia --we will obtain ACS labs given abnormal vital signs, potential atypical presentation of ACS r/o, do favor MSK as most likely   Olene Floss, PA-C 04/02/21 1018    Mancel Bale, MD 04/02/21 2039

## 2021-04-03 NOTE — ED Notes (Signed)
Pt left AMA °

## 2022-01-15 ENCOUNTER — Other Ambulatory Visit: Payer: Self-pay | Admitting: Nurse Practitioner

## 2022-01-15 DIAGNOSIS — Z780 Asymptomatic menopausal state: Secondary | ICD-10-CM

## 2022-04-13 ENCOUNTER — Other Ambulatory Visit (HOSPITAL_BASED_OUTPATIENT_CLINIC_OR_DEPARTMENT_OTHER): Payer: Self-pay | Admitting: Nurse Practitioner

## 2022-04-13 DIAGNOSIS — M25551 Pain in right hip: Secondary | ICD-10-CM

## 2022-04-13 DIAGNOSIS — M25561 Pain in right knee: Secondary | ICD-10-CM

## 2022-04-13 DIAGNOSIS — M25571 Pain in right ankle and joints of right foot: Secondary | ICD-10-CM

## 2022-10-08 ENCOUNTER — Other Ambulatory Visit: Payer: Self-pay | Admitting: Nurse Practitioner

## 2022-10-08 DIAGNOSIS — Z1231 Encounter for screening mammogram for malignant neoplasm of breast: Secondary | ICD-10-CM

## 2022-10-17 ENCOUNTER — Ambulatory Visit: Payer: Self-pay

## 2024-03-17 ENCOUNTER — Encounter (HOSPITAL_COMMUNITY): Payer: Self-pay

## 2024-03-17 ENCOUNTER — Emergency Department (HOSPITAL_COMMUNITY)
Admission: EM | Admit: 2024-03-17 | Discharge: 2024-03-18 | Disposition: A | Discharge status: Home / Self Care | Attending: Emergency Medicine | Admitting: Emergency Medicine

## 2024-03-17 ENCOUNTER — Other Ambulatory Visit: Payer: Self-pay

## 2024-03-17 DIAGNOSIS — Z7984 Long term (current) use of oral hypoglycemic drugs: Secondary | ICD-10-CM | POA: Diagnosis not present

## 2024-03-17 DIAGNOSIS — R739 Hyperglycemia, unspecified: Secondary | ICD-10-CM

## 2024-03-17 DIAGNOSIS — E1165 Type 2 diabetes mellitus with hyperglycemia: Secondary | ICD-10-CM | POA: Diagnosis not present

## 2024-03-17 DIAGNOSIS — Z7982 Long term (current) use of aspirin: Secondary | ICD-10-CM | POA: Insufficient documentation

## 2024-03-17 DIAGNOSIS — N3000 Acute cystitis without hematuria: Secondary | ICD-10-CM

## 2024-03-17 LAB — COMPREHENSIVE METABOLIC PANEL WITH GFR
ALT: 18 U/L (ref 0–44)
AST: 19 U/L (ref 15–41)
Albumin: 3.8 g/dL (ref 3.5–5.0)
Alkaline Phosphatase: 67 U/L (ref 38–126)
Anion gap: 14 (ref 5–15)
BUN: 22 mg/dL (ref 8–23)
CO2: 24 mmol/L (ref 22–32)
Calcium: 9.4 mg/dL (ref 8.9–10.3)
Chloride: 93 mmol/L — ABNORMAL LOW (ref 98–111)
Creatinine, Ser: 1.02 mg/dL — ABNORMAL HIGH (ref 0.44–1.00)
GFR, Estimated: >60 mL/min (ref >60)
Glucose, Bld: 428 mg/dL — ABNORMAL HIGH (ref 70–99)
Potassium: 4.2 mmol/L (ref 3.5–5.1)
Sodium: 131 mmol/L — ABNORMAL LOW (ref 135–145)
Total Bilirubin: 0.6 mg/dL (ref 0.0–1.2)
Total Protein: 7.2 g/dL (ref 6.5–8.1)

## 2024-03-17 LAB — I-STAT VENOUS BLOOD GAS, ED
Acid-Base Excess: 3 mmol/L — ABNORMAL HIGH (ref 0.0–2.0)
Bicarbonate: 27.1 mmol/L (ref 20.0–28.0)
Calcium, Ion: 1.18 mmol/L (ref 1.15–1.40)
HCT: 34 % — ABNORMAL LOW (ref 36.0–46.0)
Hemoglobin: 11.6 g/dL — ABNORMAL LOW (ref 12.0–15.0)
O2 Saturation: 66 %
Potassium: 4.3 mmol/L (ref 3.5–5.1)
Sodium: 130 mmol/L — ABNORMAL LOW (ref 135–145)
TCO2: 28 mmol/L (ref 22–32)
pCO2, Ven: 37.8 mmHg — ABNORMAL LOW (ref 44–60)
pH, Ven: 7.463 — ABNORMAL HIGH (ref 7.25–7.43)
pO2, Ven: 32 mmHg (ref 32–45)

## 2024-03-17 LAB — CBC WITH DIFFERENTIAL/PLATELET
Abs Immature Granulocytes: 0.03 K/uL (ref 0.00–0.07)
Basophils Absolute: 0 K/uL (ref 0.0–0.1)
Basophils Relative: 1 %
Eosinophils Absolute: 0.1 K/uL (ref 0.0–0.5)
Eosinophils Relative: 2 %
HCT: 34.4 % — ABNORMAL LOW (ref 36.0–46.0)
Hemoglobin: 10.9 g/dL — ABNORMAL LOW (ref 12.0–15.0)
Immature Granulocytes: 1 %
Lymphocytes Relative: 34 %
Lymphs Abs: 2.2 K/uL (ref 0.7–4.0)
MCH: 25.8 pg — ABNORMAL LOW (ref 26.0–34.0)
MCHC: 31.7 g/dL (ref 30.0–36.0)
MCV: 81.5 fL (ref 80.0–100.0)
Monocytes Absolute: 0.4 K/uL (ref 0.1–1.0)
Monocytes Relative: 6 %
Neutro Abs: 3.9 K/uL (ref 1.7–7.7)
Neutrophils Relative %: 56 %
Platelets: 286 K/uL (ref 150–400)
RBC: 4.22 MIL/uL (ref 3.87–5.11)
RDW: 12.5 % (ref 11.5–15.5)
WBC: 6.7 K/uL (ref 4.0–10.5)
nRBC: 0 % (ref 0.0–0.2)

## 2024-03-17 LAB — I-STAT CHEM 8, ED
BUN: 22 mg/dL (ref 8–23)
Calcium, Ion: 1.13 mmol/L — ABNORMAL LOW (ref 1.15–1.40)
Chloride: 94 mmol/L — ABNORMAL LOW (ref 98–111)
Creatinine, Ser: 1 mg/dL (ref 0.44–1.00)
Glucose, Bld: 441 mg/dL — ABNORMAL HIGH (ref 70–99)
HCT: 35 % — ABNORMAL LOW (ref 36.0–46.0)
Hemoglobin: 11.9 g/dL — ABNORMAL LOW (ref 12.0–15.0)
Potassium: 4.4 mmol/L (ref 3.5–5.1)
Sodium: 132 mmol/L — ABNORMAL LOW (ref 135–145)
TCO2: 26 mmol/L (ref 22–32)

## 2024-03-17 LAB — CBG MONITORING, ED: Glucose-Capillary: 381 mg/dL — ABNORMAL HIGH (ref 70–99)

## 2024-03-17 NOTE — ED Triage Notes (Signed)
 Pt here from PCP for hyperglycemia, pt states BS was 500. No urinary frequency.. Denies n/v/abd pain.

## 2024-03-17 NOTE — ED Provider Triage Note (Signed)
 Emergency Medicine Provider Triage Evaluation Note  Michelle Melendez , a 68 y.o. female  was evaluated in triage.  Pt complains of hyperglycemia. Ate a large fry and soda, went to PCP blood sugar 500s, sent to the ER. No recent illness. Reports meds as prescribed.   Review of Systems  Positive:  Negative:   Physical Exam  BP (!) 143/78 (BP Location: Right Arm)   Pulse (!) 101   Temp 98.1 F (36.7 C) (Oral)   Resp 17   Ht 5' (1.524 m)   Wt 67.1 kg   SpO2 96%   BMI 28.90 kg/m  Gen:   Awake, no distress   Resp:  Normal effort  MSK:   Moves extremities without difficulty  Other:    Medical Decision Making  Medically screening exam initiated at 5:10 PM.  Appropriate orders placed.  Michelle Melendez was informed that the remainder of the evaluation will be completed by another provider, this initial triage assessment does not replace that evaluation, and the importance of remaining in the ED until their evaluation is complete.     Beverley Leita LABOR, PA-C 03/17/24 1711

## 2024-03-18 LAB — URINALYSIS, ROUTINE W REFLEX MICROSCOPIC
Bilirubin Urine: NEGATIVE
Glucose, UA: >=500 mg/dL — AB
Hgb urine dipstick: NEGATIVE
Ketones, ur: NEGATIVE mg/dL
Leukocytes,Ua: NEGATIVE
Nitrite: NEGATIVE
Protein, ur: 100 mg/dL — AB
Specific Gravity, Urine: 1.016 (ref 1.005–1.030)
pH: 5 (ref 5.0–8.0)

## 2024-03-18 LAB — CBG MONITORING, ED
Glucose-Capillary: 326 mg/dL — ABNORMAL HIGH (ref 70–99)
Glucose-Capillary: 437 mg/dL — ABNORMAL HIGH (ref 70–99)

## 2024-03-18 MED ORDER — CEPHALEXIN 500 MG PO CAPS: 500.0000 mg | ORAL_CAPSULE | Freq: Four times a day (QID) | ORAL | 0 refills | Status: AC

## 2024-03-18 MED ORDER — INSULIN ASPART 100 UNIT/ML IJ SOLN
7.0000 [IU] | Freq: Once | INTRAMUSCULAR | Status: AC
  Administered 2024-03-18: 7 [IU] via SUBCUTANEOUS

## 2024-03-18 NOTE — ED Provider Notes (Signed)
 [place] EMERGENCY DEPARTMENT AT [facility] Provider Note   CSN: [id number] Arrival date & time: 03/17/24  1641     Patient presents with: Hyperglycemia   Michelle Melendez is a 68 y.o. female.   The history is provided by the patient.  Hyperglycemia Blood sugar level PTA:  500 Severity:  Severe Onset quality:  Gradual Duration:  1 day Timing:  Constant Progression:  Unchanged Chronicity:  Chronic Diabetes status:  Controlled with insulin and controlled with oral medications Context: not change in medication, not insulin pump use and not new diabetes diagnosis   Context comment:  Ate fies and large sprite, though he glucose is normally poorly controlled Relieved by:  Nothing Ineffective treatments:  None tried Associated symptoms: no abdominal pain, no increased thirst, no nausea, no polyuria and no shortness of breath   Patient with poorly controlled DM with elevated glucoses post eating fries and drinking a large sprite.  No missed doses of medications.  No associated symptoms      Prior to Admission medications   Medication Sig Start Date End Date Taking? Authorizing Provider  amLODipine (NORVASC) 5 MG tablet Take 5 mg by mouth daily. Patient not taking: No sig reported    [provider]  aspirin EC 81 MG tablet Take 81 mg by mouth daily. Swallow whole.    [provider]  benazepril (LOTENSIN) 10 MG tablet Take 10 mg by mouth daily. Patient not taking: No sig reported    [provider]  gabapentin (NEURONTIN) 300 MG capsule Take 300 mg by mouth 3 (three) times daily.    [provider]  glyBURIDE (DIABETA) 5 MG tablet Take 5 mg by mouth daily with breakfast.    [provider]  metFORMIN (GLUCOPHAGE) 500 MG tablet Take 1,000 mg by mouth 2 (two) times daily with a meal.    [provider]  simvastatin (ZOCOR) 80 MG tablet Take 80 mg by mouth daily.    [provider]    Allergies: Patient has  no known allergies.    Review of Systems  Respiratory:  Negative for shortness of breath.   Gastrointestinal:  Negative for abdominal pain and nausea.  Endocrine: Negative for polydipsia, polyphagia and polyuria.  All other systems reviewed and are negative.   Updated Vital Signs BP (!) 142/88   Pulse 96   Temp 98 F (36.7 C) (Oral)   Resp 16   Ht 5' (1.524 m)   Wt 67.1 kg   SpO2 97%   BMI 28.90 kg/m   Physical Exam Vitals and nursing note reviewed.  Constitutional:      General: She is not in acute distress.    Appearance: She is well-developed.  HENT:     Head: Normocephalic and atraumatic.     Nose: Nose normal.  Eyes:     Pupils: Pupils are equal, round, and reactive to light.  Cardiovascular:     Rate and Rhythm: Normal rate and regular rhythm.     Pulses: Normal pulses.     Heart sounds: Normal heart sounds.  Pulmonary:     Effort: Pulmonary effort is normal. No respiratory distress.     Breath sounds: Normal breath sounds.  Abdominal:     General: Bowel sounds are normal. There is no distension.     Palpations: Abdomen is soft.     Tenderness: There is no abdominal tenderness. There is no guarding or rebound.  Musculoskeletal:  General: Normal range of motion.     Cervical back: Neck supple.  Skin:    General: Skin is dry.     Capillary Refill: Capillary refill takes less than 2 seconds.     Findings: No erythema or rash.  Neurological:     General: No focal deficit present.     Deep Tendon Reflexes: Reflexes normal.  Psychiatric:        Mood and Affect: Mood normal.     (all labs ordered are listed, but only abnormal results are displayed) Results for orders placed or performed during the hospital encounter of 03/17/24  CBG monitoring, ED   Collection Time: 03/17/24  5:10 PM  Result Value Ref Range   Glucose-Capillary 381 (H) 70 - 99 mg/dL  Comprehensive metabolic panel   Collection Time: 03/17/24  5:21 PM  Result Value Ref Range    Sodium 131 (L) 135 - 145 mmol/L   Potassium 4.2 3.5 - 5.1 mmol/L   Chloride 93 (L) 98 - 111 mmol/L   CO2 24 22 - 32 mmol/L   Glucose, Bld 428 (H) 70 - 99 mg/dL   BUN 22 8 - 23 mg/dL   Creatinine, Ser 8.97 (H) 0.44 - 1.00 mg/dL   Calcium 9.4 8.9 - 89.6 mg/dL   Total Protein 7.2 6.5 - 8.1 g/dL   Albumin 3.8 3.5 - 5.0 g/dL   AST 19 15 - 41 U/L   ALT 18 0 - 44 U/L   Alkaline Phosphatase 67 38 - 126 U/L   Total Bilirubin 0.6 0.0 - 1.2 mg/dL   GFR, Estimated >39 >39 mL/min   Anion gap 14 5 - 15  CBC with Differential   Collection Time: 03/17/24  5:21 PM  Result Value Ref Range   WBC 6.7 4.0 - 10.5 K/uL   RBC 4.22 3.87 - 5.11 MIL/uL   Hemoglobin 10.9 (L) 12.0 - 15.0 g/dL   HCT 65.5 (L) 63.9 - 53.9 %   MCV 81.5 80.0 - 100.0 fL   MCH 25.8 (L) 26.0 - 34.0 pg   MCHC 31.7 30.0 - 36.0 g/dL   RDW 87.4 88.4 - 84.4 %   Platelets 286 150 - 400 K/uL   nRBC 0.0 0.0 - 0.2 %   Neutrophils Relative % 56 %   Neutro Abs 3.9 1.7 - 7.7 K/uL   Lymphocytes Relative 34 %   Lymphs Abs 2.2 0.7 - 4.0 K/uL   Monocytes Relative 6 %   Monocytes Absolute 0.4 0.1 - 1.0 K/uL   Eosinophils Relative 2 %   Eosinophils Absolute 0.1 0.0 - 0.5 K/uL   Basophils Relative 1 %   Basophils Absolute 0.0 0.0 - 0.1 K/uL   Immature Granulocytes 1 %   Abs Immature Granulocytes 0.03 0.00 - 0.07 K/uL  I-stat chem 8, ED   Collection Time: 03/17/24  5:29 PM  Result Value Ref Range   Sodium 132 (L) 135 - 145 mmol/L   Potassium 4.4 3.5 - 5.1 mmol/L   Chloride 94 (L) 98 - 111 mmol/L   BUN 22 8 - 23 mg/dL   Creatinine, Ser 8.99 0.44 - 1.00 mg/dL   Glucose, Bld 558 (H) 70 - 99 mg/dL   Calcium, Ion 8.86 (L) 1.15 - 1.40 mmol/L   TCO2 26 22 - 32 mmol/L   Hemoglobin 11.9 (L) 12.0 - 15.0 g/dL   HCT 64.9 (L) 63.9 - 53.9 %  I-Stat venous blood gas, ([facility], [facility], [facility])   Collection Time: 03/17/24  5:29  PM  Result Value Ref Range   pH, Ven 7.463 (H) 7.25 - 7.43   pCO2, Ven 37.8 (L) 44 - 60 mmHg   pO2, Ven 32 32 - 45 mmHg    Bicarbonate 27.1 20.0 - 28.0 mmol/L   TCO2 28 22 - 32 mmol/L   O2 Saturation 66 %   Acid-Base Excess 3.0 (H) 0.0 - 2.0 mmol/L   Sodium 130 (L) 135 - 145 mmol/L   Potassium 4.3 3.5 - 5.1 mmol/L   Calcium, Ion 1.18 1.15 - 1.40 mmol/L   HCT 34.0 (L) 36.0 - 46.0 %   Hemoglobin 11.6 (L) 12.0 - 15.0 g/dL   Sample type VENOUS    Comment NOTIFIED PHYSICIAN   CBG monitoring, ED   Collection Time: 03/18/24 12:37 AM  Result Value Ref Range   Glucose-Capillary 437 (H) 70 - 99 mg/dL   Comment 1 Document in Chart   Urinalysis, Routine w reflex microscopic -Urine, Clean Catch   Collection Time: 03/18/24  1:20 AM  Result Value Ref Range   Color, Urine YELLOW YELLOW   APPearance HAZY (A) CLEAR   Specific Gravity, Urine 1.016 1.005 - 1.030   pH 5.0 5.0 - 8.0   Glucose, UA >=500 (A) NEGATIVE mg/dL   Hgb urine dipstick NEGATIVE NEGATIVE   Bilirubin Urine NEGATIVE NEGATIVE   Ketones, ur NEGATIVE NEGATIVE mg/dL   Protein, ur 899 (A) NEGATIVE mg/dL   Nitrite NEGATIVE NEGATIVE   Leukocytes,Ua NEGATIVE NEGATIVE   RBC / HPF 0-5 0 - 5 RBC/hpf   WBC, UA 6-10 0 - 5 WBC/hpf   Bacteria, UA MANY (A) NONE SEEN   Squamous Epithelial / HPF 0-5 0 - 5 /HPF   Mucus PRESENT    Hyaline Casts, UA PRESENT   CBG monitoring, ED   Collection Time: 03/18/24  2:15 AM  Result Value Ref Range   Glucose-Capillary 326 (H) 70 - 99 mg/dL   No results found.   Radiology: No results found.   Procedures   Medications Ordered in the ED  insulin aspart (novoLOG) injection 7 Units (7 Units Subcutaneous Given 03/18/24 0114)                                    Medical Decision Making Sent in by PCP for elevated glucose   Amount and/or Complexity of Data Reviewed Independent Historian: spouse    Details: See above  External Data Reviewed: notes.    Details: Previous notes reviewed  Labs: ordered.    Details: Urine consistent with UTI, no ketones.  Sodium 131, corrects to normal for glucose, creatinine slight  elevation 1.02, elevated glucose 428, normal anion gap.  Normal white count 6.7, slight low hemoglobin 10.9  Risk Prescription drug management. Risk Details: Well appearing,  no associated symptoms.  Has poorly controlled glucose at baseline.  Ate carb laden meal with soda.  No evidence of DKA.  Dose of insulin given in the ED>  stable for discharge with close follow up.  Strict returns      Final diagnoses:  Hyperglycemia   No signs of systemic illness or infection. The patient is nontoxic-appearing on exam and vital signs are within normal limits.  I have reviewed the triage vital signs and the nursing notes. Pertinent labs & imaging results that were available during my care of the patient were reviewed by me and considered in my medical decision making (see chart for details).  After history, exam, and medical workup I feel the patient has been appropriately medically screened and is safe for discharge home. Pertinent diagnoses were discussed with the patient. Patient was given return precautions.      ED Discharge Orders     None          Kelce Bouton, MD 03/18/24 [phone]
